# Patient Record
Sex: Male | Born: 1945 | Race: White | Hispanic: No | Marital: Married
Health system: Midwestern US, Community
[De-identification: ages and names within clinical notes are randomized; demographics above are authoritative.]

## PROBLEM LIST (undated history)

## (undated) DIAGNOSIS — R600 Localized edema: Secondary | ICD-10-CM

## (undated) DIAGNOSIS — M170 Bilateral primary osteoarthritis of knee: Secondary | ICD-10-CM

## (undated) DIAGNOSIS — G4730 Sleep apnea, unspecified: Secondary | ICD-10-CM

## (undated) DIAGNOSIS — M21379 Foot drop, unspecified foot: Secondary | ICD-10-CM

## (undated) DIAGNOSIS — E7800 Pure hypercholesterolemia, unspecified: Secondary | ICD-10-CM

## (undated) DIAGNOSIS — N3281 Overactive bladder: Secondary | ICD-10-CM

## (undated) DIAGNOSIS — E876 Hypokalemia: Secondary | ICD-10-CM

## (undated) DIAGNOSIS — R011 Cardiac murmur, unspecified: Secondary | ICD-10-CM

## (undated) DIAGNOSIS — Z95828 Presence of other vascular implants and grafts: Secondary | ICD-10-CM

## (undated) DIAGNOSIS — M1612 Unilateral primary osteoarthritis, left hip: Secondary | ICD-10-CM

## (undated) DIAGNOSIS — Z1211 Encounter for screening for malignant neoplasm of colon: Secondary | ICD-10-CM

## (undated) DIAGNOSIS — G4733 Obstructive sleep apnea (adult) (pediatric): Secondary | ICD-10-CM

## (undated) DIAGNOSIS — I498 Other specified cardiac arrhythmias: Secondary | ICD-10-CM

## (undated) DIAGNOSIS — R9431 Abnormal electrocardiogram [ECG] [EKG]: Secondary | ICD-10-CM

## (undated) DIAGNOSIS — H2510 Age-related nuclear cataract, unspecified eye: Secondary | ICD-10-CM

## (undated) HISTORY — PX: FOOT SURGERY: SHX648

## (undated) HISTORY — PX: CATARACT EXTRACTION: SUR2

## (undated) HISTORY — PX: HIP SURGERY: SHX245

---

## 2003-10-17 HISTORY — PX: IVC FILTER PLACEMENT (ARMC HX): HXRAD1551

## 2011-04-07 NOTE — ED Notes (Signed)
Pt relates pain free at present. Pending md re-eval.

## 2011-04-07 NOTE — ED Notes (Signed)
Discharge and follow up instructions given to patient. Patient advised to follow up as directed or return to ER for symptoms of concern.

## 2011-04-07 NOTE — ED Notes (Signed)
Pt medicated as noted

## 2011-04-07 NOTE — ED Notes (Signed)
Pt developed pain r toe without trauma. Said pain was a 9 then wife changed sock then pain went to a 4 said was very red.

## 2011-04-08 LAB — METABOLIC PANEL, COMPREHENSIVE
A-G Ratio: 0.8 — ABNORMAL LOW (ref 1.0–1.5)
ALT (SGPT): 34 U/L (ref 30–65)
AST (SGOT): 14 U/L — ABNORMAL LOW (ref 15–37)
Albumin: 3.3 g/dL — ABNORMAL LOW (ref 3.4–5.0)
Alk. phosphatase: 93 U/L (ref 50–136)
Anion gap: 8 mmol/L — ABNORMAL LOW (ref 10–20)
BUN: 24 mg/dL — ABNORMAL HIGH (ref 7–20)
Bilirubin, total: 0.6 mg/dL (ref 0.2–1.0)
CO2: 27 mmol/L (ref 21–32)
Calcium: 8.5 mg/dL (ref 8.5–10.1)
Chloride: 105 mmol/L (ref 98–107)
Creatinine: 0.9 mg/dL (ref 0.6–1.3)
GFR est AA: >60 mL/min/{1.73_m2} (ref >60)
GFR est non-AA: >60 mL/min/{1.73_m2} (ref >60)
Globulin: 4.1 g/dL (ref 2.5–5.0)
Glucose: 95 mg/dL (ref 70–100)
Potassium: 3.8 mmol/L (ref 3.5–5.1)
Protein, total: 7.4 g/dL (ref 6.4–8.2)
Sodium: 140 mmol/L (ref 136–145)

## 2011-04-08 LAB — CBC WITH AUTOMATED DIFF
ABS. BASOPHILS: 0 10*3/uL (ref 0.0–0.1)
ABS. EOSINOPHILS: 0.2 10*3/uL (ref 0.0–0.2)
ABS. LYMPHOCYTES: 2.1 10*3/uL (ref 1.0–5.5)
ABS. MONOCYTES: 1.1 10*3/uL — ABNORMAL HIGH (ref 0.1–1.0)
ABS. NEUTROPHILS: 6.1 10*3/uL (ref 2.0–8.1)
BASOPHILS: 0 % (ref 0.0–1.0)
EOSINOPHILS: 2 % (ref 0.0–2.0)
HCT: 41.9 % — ABNORMAL LOW (ref 42.0–52.0)
HGB: 13.6 g/dL — ABNORMAL LOW (ref 14.0–18.0)
LYMPHOCYTES: 22 % (ref 20.5–51.1)
MCH: 27.6 PG (ref 27.0–31.0)
MCHC: 32.5 g/dL (ref 30.5–36.0)
MCV: 85 FL (ref 80.0–96.0)
MONOCYTES: 11 % — ABNORMAL HIGH (ref 1.7–10.0)
MPV: 11.1 FL (ref 10.2–12.7)
NEUTROPHILS: 65 % (ref 42.2–75.2)
PLATELET: 144 10*3/uL (ref 122–400)
RBC: 4.93 M/uL (ref 4.70–6.10)
RDW: 14.8 % — ABNORMAL HIGH (ref 11.4–14.6)
WBC: 9.4 10*3/uL (ref 4.8–10.8)

## 2011-04-08 LAB — URIC ACID: Uric acid: 4.3 mg/dL (ref 3.5–7.2)

## 2011-04-08 MED ORDER — NAPROXEN SODIUM 550 MG TAB
550 mg | ORAL_TABLET | Freq: Two times a day (BID) | ORAL | Status: DC
Start: 2011-04-08 — End: 2013-07-10

## 2011-04-08 MED ORDER — CEPHALEXIN 500 MG CAP
500 mg | ORAL_CAPSULE | Freq: Four times a day (QID) | ORAL | Status: AC
Start: 2011-04-08 — End: 2011-04-14

## 2011-04-08 MED ORDER — KETOROLAC TROMETHAMINE 30 MG/ML INJECTION
30 mg/mL (1 mL) | INTRAMUSCULAR | Status: DC
Start: 2011-04-08 — End: 2011-04-08

## 2011-04-08 MED ORDER — COLCHICINE 0.6 MG TAB
0.6 mg | ORAL | Status: DC
Start: 2011-04-08 — End: 2011-04-08
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
## 2013-05-23 LAB — METABOLIC PANEL, COMPREHENSIVE
A-G Ratio: 0.9 — ABNORMAL LOW (ref 1.0–1.5)
ALT (SGPT): 33 U/L (ref 12–78)
AST (SGOT): 17 U/L (ref 15–37)
Albumin: 3.6 g/dL (ref 3.4–5.0)
Alk. phosphatase: 102 U/L (ref 50–136)
Anion gap: 9 mmol/L (ref 8–20)
BUN: 18 mg/dL (ref 7–18)
Bilirubin, total: 0.7 mg/dL (ref 0.2–1.0)
CO2: 27 mmol/L (ref 21–32)
Calcium: 9.1 mg/dL (ref 8.5–10.1)
Chloride: 105 mmol/L (ref 98–107)
Creatinine: 1 mg/dL (ref 0.6–1.3)
GFR est AA: >60 mL/min/{1.73_m2} (ref >60)
GFR est non-AA: >60 mL/min/{1.73_m2} (ref >60)
Globulin: 4.1 g/dL (ref 2.5–5.0)
Glucose: 111 mg/dL — ABNORMAL HIGH (ref 74–106)
Potassium: 4.4 mmol/L (ref 3.5–5.1)
Protein, total: 7.7 g/dL (ref 6.4–8.2)
Sodium: 141 mmol/L (ref 136–145)

## 2013-05-23 LAB — URINALYSIS W/ RFLX MICROSCOPIC
Bilirubin: NEGATIVE
Glucose: NEGATIVE mg/dL
Ketone: NEGATIVE mg/dL
Leukocyte Esterase: NEGATIVE
Nitrites: NEGATIVE
Protein: NEGATIVE mg/dL
Specific gravity: >1.03 — ABNORMAL HIGH (ref 1.005–1.030)
Urobilinogen: 0.2 EU/dL (ref 0.1–1.0)
pH (UA): 6 (ref 4.5–8.0)

## 2013-05-23 LAB — CBC W/O DIFF
HCT: 47.5 % (ref 42.0–52.0)
HGB: 15.1 g/dL (ref 14.0–18.0)
MCH: 27.7 PG (ref 27.0–31.0)
MCHC: 31.8 g/dL (ref 30.5–36.0)
MCV: 87 FL (ref 80.0–96.0)
MPV: 11.9 FL (ref 10.2–12.7)
PLATELET: 142 10*3/uL (ref 122–400)
RBC: 5.46 M/uL (ref 4.70–6.10)
RDW: 15 % — ABNORMAL HIGH (ref 11.4–14.6)
WBC: 9.6 10*3/uL (ref 4.8–10.8)

## 2013-05-23 LAB — LIPID PANEL
CHOL/HDL Ratio: 4
Cholesterol, total: 157 mg/dL (ref <200)
HDL Cholesterol: 39 mg/dL — ABNORMAL LOW (ref 40–60)
LDL, calculated: 109.2 MG/DL (ref 0–130)
Triglyceride: 44 mg/dL (ref <150)
VLDL, calculated: 8.8 MG/DL (ref 3–35)

## 2013-05-23 LAB — PSA SCREENING (SCREENING): Prostate Specific Ag: 1.9 ng/mL

## 2013-05-23 LAB — URINE MICROSCOPIC

## 2013-05-23 LAB — VITAMIN D, 25 HYDROXY: Vitamin D 25-Hydroxy: 25.3 ng/mL

## 2013-05-23 LAB — HEMOGLOBIN A1C WITH EAG: Hemoglobin A1c: 5.8 % (ref 4.2–6.3)

## 2013-05-23 LAB — TSH 3RD GENERATION: TSH: 1.05 u[IU]/mL (ref 0.360–3.740)

## 2013-07-10 MED ADMIN — acetaminophen (TYLENOL) tablet 1,000 mg: ORAL | @ 15:00:00 | NDC 50580045103

## 2013-07-10 MED ADMIN — lidocaine (XYLOCAINE) 10 mg/mL (1 %) injection 2 mL: INTRADERMAL | @ 15:00:00 | NDC 00409427601
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
## 2013-07-10 NOTE — ED Notes (Signed)
Pt has middle left finger infection   Saw his doctor and was put on a white pill  ??? Pt sts its getting worse   Purple coloration

## 2013-07-10 NOTE — ED Notes (Signed)
Pt is ready for disch to home Patient is awake, alert, and oriented, speech is clear and patient is able to ambulate (if applicable) and ready for discharge. Verbal and written discharge instructions provided and has the cognitive understanding of discharge instructions. Discharged home with family. All questions answered.to follow with pmd for further eval

## 2013-07-20 MED ADMIN — ibuprofen (MOTRIN) tablet 800 mg: ORAL | @ 12:00:00 | NDC 63739044410

## 2013-07-20 MED ADMIN — tramadol (ULTRAM) tablet 50 mg: ORAL | @ 14:00:00 | NDC 62584055911
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
## 2013-07-20 NOTE — ED Notes (Signed)
Lunch tray provided to pt.
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
## 2013-07-20 NOTE — ED Notes (Signed)
Patient is awake, alert, and oriented, speech is clear and patient is able to ambulate and ready for discharge. Verbal and written discharge instructions provided and has the cognitive understanding of discharge instructions. Discharged home with family. All questions answered.
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
## 2013-07-22 LAB — CULTURE, WOUND W GRAM STAIN

## 2013-09-30 LAB — URINALYSIS W/ RFLX MICROSCOPIC
Bilirubin: NEGATIVE
Blood: NEGATIVE
Glucose: NEGATIVE mg/dL
Ketone: NEGATIVE mg/dL
Leukocyte Esterase: NEGATIVE
Nitrites: NEGATIVE
Protein: NEGATIVE mg/dL
Specific gravity: >1.03 — ABNORMAL HIGH (ref 1.005–1.030)
Urobilinogen: 0.2 EU/dL (ref 0.1–1.0)
pH (UA): 5.5 (ref 4.5–8.0)

## 2013-09-30 LAB — URINE MICROSCOPIC

## 2013-10-01 LAB — CULTURE, URINE
Culture result:: <10000
Culture: <10000

## 2013-11-20 LAB — CBC W/O DIFF
HCT: 45.6 % (ref 42.0–52.0)
HGB: 14.7 g/dL (ref 14.0–18.0)
MCH: 28.1 PG (ref 27.0–31.0)
MCHC: 32.2 g/dL (ref 30.5–36.0)
MCV: 87 FL (ref 80.0–96.0)
MPV: 12.3 FL (ref 10.2–12.7)
PLATELET: 161 10*3/uL (ref 122–400)
RBC: 5.24 M/uL (ref 4.70–6.10)
RDW: 14.7 % — ABNORMAL HIGH (ref 11.4–14.6)
WBC: 9.8 10*3/uL (ref 4.8–10.8)

## 2013-11-20 LAB — METABOLIC PANEL, BASIC
Anion gap: 8 mmol/L (ref 8–20)
BUN: 29 mg/dL — ABNORMAL HIGH (ref 7–18)
CO2: 27 mmol/L (ref 21–32)
Calcium: 9.6 mg/dL (ref 8.5–10.1)
Chloride: 107 mmol/L (ref 98–107)
Creatinine: 0.9 mg/dL (ref 0.6–1.3)
GFR est AA: >60 mL/min/{1.73_m2} (ref >60)
GFR est non-AA: >60 mL/min/{1.73_m2} (ref >60)
Glucose: 108 mg/dL — ABNORMAL HIGH (ref 74–106)
Potassium: 4.3 mmol/L (ref 3.5–5.1)
Sodium: 142 mmol/L (ref 136–145)

## 2013-11-20 LAB — EKG, 12 LEAD, INITIAL
Atrial Rate: 66 {beats}/min
Calculated P Axis: 25 degrees
Calculated R Axis: 19 degrees
Calculated T Axis: 62 degrees
P-R Interval: 160 ms
Q-T Interval: 416 ms
QRS Duration: 80 ms
QTC Calculation (Bezet): 436 ms
Ventricular Rate: 66 {beats}/min
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
## 2013-12-02 NOTE — H&P (Signed)
CC:  Progressive loss of vision in his  left eye.  HPI:  This is a 68 y.o. male who has had progressive loss of vision in  his left eye.  He is being admitted for cataract surgery in the left eye.    Ophthalmic history:  Epiretinal membrane right eye;  PPV/membrane peel right eye    Medical history:   Past Medical History   Diagnosis Date   ??? Gastrointestinal disorder      diverticulitis   ??? Arrhythmia      heart murmur   ??? Heart murmur    ??? High cholesterol    ??? Unspecified sleep apnea      uses cpap machine   ??? Arthritis      hips- ? arthritis   ??? Ill-defined condition      dropped right foot- wears brace       Current medications:   Current Outpatient Prescriptions   Medication Sig Dispense Refill   ??? silodosin (RAPAFLO) 4 mg capsule Take 4 mg by mouth daily (with breakfast).       ??? simvastatin (ZOCOR) 10 mg tablet Take 10 mg by mouth nightly.           Allergies: No Known Allergies     Visual acuity:Corrected:            L  20/30            R 20/100  Pupils: equal, round, reactive to light and accommodation no afferent pupillary defect  Ocular motility:extra-ocular motions intact  Slit lamp exam:   Lids and lashes: clean and without defect left eye   Conjunctiva: white and quiet left eye   Cornea: clear and quiet left eye   Anterior Chamber: quiet and deep left eye   Lens: combined cataract left eye    Fundus examination:   normal w/o hemorrhages, exudates, or papilledema, left eye        Impression:  1. Cataract, left eye    Treatment plan:  1. Cataract extraction and intraocular lens implantation,  left eye.  2.  The nature of cataract surgery, and the risks, benefits and alternatives of cataract surgery, including but not limited to, infection, bleeding, retinal detachment, failure to remove cataract, need for additional surgery, vision loss and blindness were discussed with the patient.  All of his questions were answered.  He wishes to have cataract surgery performed.  3.  Medical clearnance by Dr. Regino SchultzeWang.

## 2013-12-03 ENCOUNTER — Inpatient Hospital Stay: Payer: MEDICARE

## 2013-12-03 MED ORDER — OFLOXACIN 0.3 % EYE DROPS
0.3 % | Freq: Once | OPHTHALMIC | Status: AC
Start: 2013-12-03 — End: 2013-12-03

## 2013-12-03 MED ORDER — TRYPAN BLUE 0.06 % INTRAOCULAR SYRINGE
0.06 % | Freq: Once | INTRAOCULAR | Status: DC
Start: 2013-12-03 — End: 2013-12-03

## 2013-12-03 MED ORDER — ACETAMINOPHEN (TYLENOL) SOLUTION 32MG/ML
ORAL | Status: DC | PRN
Start: 2013-12-03 — End: 2013-12-03

## 2013-12-03 MED ORDER — PHENYLEPHRINE 10 % EYE DROPS
10 % | OPHTHALMIC | Status: AC
Start: 2013-12-03 — End: 2013-12-03
  Administered 2013-12-03: 14:00:00 via OPHTHALMIC

## 2013-12-03 MED ORDER — EPINEPHRINE (PF) 1 MG/ML INJECTION
1 mg/mL ( mL) | Freq: Once | INTRAMUSCULAR | Status: AC
Start: 2013-12-03 — End: 2013-12-03
  Administered 2013-12-03: 15:00:00

## 2013-12-03 MED ORDER — CHONDROITIN-SOD HYALURON 3 %-4 % (0.5 ML) 1 %(0.55 ML) INTRAOCULAR KIT
3 %-4 %(0.5 mL) 1 % (0.55 mL) | Freq: Once | INTRAOCULAR | Status: AC
Start: 2013-12-03 — End: 2013-12-03
  Administered 2013-12-03: 15:00:00 via INTRAOCULAR

## 2013-12-03 MED ORDER — BALANCED SALT IRRIG SOLN COMB2 INTRAOCULAR
INTRAOCULAR | Status: AC
Start: 2013-12-03 — End: 2013-12-03
  Administered 2013-12-03: 15:00:00 via INTRAOCULAR

## 2013-12-03 MED ORDER — POVIDONE-IODINE 5 % EYE SOLN
5 % | Freq: Once | OPHTHALMIC | Status: AC
Start: 2013-12-03 — End: 2013-12-03
  Administered 2013-12-03: 16:00:00 via OPHTHALMIC

## 2013-12-03 MED ORDER — TROPICAMIDE 1 % EYE DROPS
1 % | OPHTHALMIC | Status: AC
Start: 2013-12-03 — End: 2013-12-03
  Administered 2013-12-03: 14:00:00 via OPHTHALMIC

## 2013-12-03 MED ORDER — ACETYLCHOLINE CHLORIDE (20 MG/2 ML) INTRAOCULAR KIT
1 % (0 mg/mL) | Freq: Once | INTRAOCULAR | Status: AC
Start: 2013-12-03 — End: 2013-12-03
  Administered 2013-12-03: 16:00:00 via INTRAOCULAR

## 2013-12-03 MED ORDER — CYCLOPENTOLATE 1 % EYE DROPS
1 % | OPHTHALMIC | Status: AC
Start: 2013-12-03 — End: 2013-12-03
  Administered 2013-12-03: 14:00:00 via OPHTHALMIC

## 2013-12-03 MED ADMIN — tetracaine (TETRAVISC) 0.5 % viscous ophthalmic solution: OPHTHALMIC | @ 13:00:00 | NDC 54799050505

## 2013-12-03 MED ADMIN — ofloxacin (FLOXIN) 0.3 % ophthalmic solution: OPHTHALMIC | @ 13:00:00 | NDC 17478071310

## 2013-12-03 MED ADMIN — midazolam (VERSED) injection: INTRAVENOUS | @ 15:00:00 | NDC 00641605725

## 2013-12-03 MED ADMIN — tetracaine (TETRAVISC) 0.5 % viscous ophthalmic solution 1 Drop: OPHTHALMIC | @ 15:00:00 | NDC 54799050505

## 2013-12-03 MED ADMIN — phenylephrine (NEO-SYNEPHRINE) 10 % ophthalmic solution: OPHTHALMIC | @ 13:00:00 | NDC 17478020510

## 2013-12-03 MED ADMIN — cyclopentolate (CYCLOGYL) 1 % ophthalmic solution: OPHTHALMIC | @ 13:00:00 | NDC 17478010002

## 2013-12-03 MED ADMIN — lidocaine (PF) 40 mg/mL (4%) (XYLOCAINE) 22.4 mg, EPINEPHrine HCl (PF) (ADRENALIN) 0.75 mg in balanced salt (BSS) 3 mL ophthalmic solution: OPHTHALMIC | @ 15:00:00 | NDC 63323049089

## 2013-12-03 MED ADMIN — tropicamide (mydRIACYL) 1 % ophthalmic solution: OPHTHALMIC | @ 13:00:00 | NDC 24208058559

## 2013-12-03 MED ADMIN — lactated ringers infusion: INTRAVENOUS | @ 15:00:00 | NDC 00409795309

## 2013-12-03 MED FILL — PHENYLEPHRINE 10 % EYE DROPS: 10 % | OPHTHALMIC | Qty: 5

## 2013-12-03 MED FILL — MIOCHOL-E 1 % (10 MG/ML) INTRAOCULAR KIT: 1 % (0 mg/mL) | INTRAOCULAR | Qty: 2

## 2013-12-03 MED FILL — NORMAL SALINE FLUSH 0.9 % INJECTION SYRINGE: INTRAMUSCULAR | Qty: 10

## 2013-12-03 MED FILL — EPINEPHRINE (PF) 1 MG/ML INJECTION: 1 mg/mL ( mL) | INTRAMUSCULAR | Qty: 4

## 2013-12-03 MED FILL — LACTATED RINGERS IV: INTRAVENOUS | Qty: 1000

## 2013-12-03 MED FILL — TETRAVISC 0.5 % VISCOUS EYE DROPS: 0.5 % | OPHTHALMIC | Qty: 5

## 2013-12-03 MED FILL — CYCLOPENTOLATE 1 % EYE DROPS: 1 % | OPHTHALMIC | Qty: 2

## 2013-12-03 MED FILL — OFLOXACIN 0.3 % EYE DROPS: 0.3 % | OPHTHALMIC | Qty: 5

## 2013-12-03 MED FILL — BETADINE OPHTHALMIC PREP 5 % SOLUTION: 5 % | OPHTHALMIC | Qty: 30

## 2013-12-03 MED FILL — FLURBIPROFEN 0.03 % EYE DROPS: 0.03 % | OPHTHALMIC | Qty: 2.5

## 2013-12-03 MED FILL — XYLOCAINE-MPF 40 MG/ML (4 %) INJECTION SOLUTION: 40 mg/mL (4 %) | INTRAMUSCULAR | Qty: 5

## 2013-12-03 MED FILL — TROPICAMIDE 1 % EYE DROPS: 1 % | OPHTHALMIC | Qty: 2

## 2013-12-03 MED FILL — MIDAZOLAM 1 MG/ML IJ SOLN: 1 mg/mL | INTRAMUSCULAR | Qty: 2

## 2013-12-03 NOTE — Anesthesia Post-Procedure Evaluation (Signed)
Post-Anesthesia Evaluation and Assessment    Patient: Patrick [name] MRN: [id number]  SSN: [id number]    Date of Birth: [birth year]  Age: 68 y.o.  Sex: male       Cardiovascular Function/Vital Signs  Visit Vitals   Item Reading   ??? BP 158/74   ??? Pulse 60   ??? Temp 36.6 ??C (97.9 ??F)   ??? Resp 17   ??? Ht 6\' 1"  (1.854 m)   ??? Wt 124.739 kg (275 lb)   ??? BMI 36.29 kg/m2   ??? SpO2 99%       Patient is status post MAC anesthesia for Procedure(s):  CATARACT EXTRACTION WITH INTRA OCULAR LENS IMPLANT LEFT .    Nausea/Vomiting: None    Postoperative hydration reviewed and adequate.    Pain:  Pain Scale 1: Numeric (0 - 10) (12/03/13 0755)  Pain Intensity 1: 0 (12/03/13 0755)   Managed    Neurological Status:       At baseline    Mental Status and Level of Consciousness: Alert and oriented     Pulmonary Status:   O2 Device: Room air (12/03/13 1042)   Adequate oxygenation and airway patent    Complications related to anesthesia: None    Post-anesthesia assessment completed. No concerns    Signed By: Charlcie CradleMarc O Falyn Rubel, MD     December 03, 2013

## 2013-12-03 NOTE — Brief Op Note (Signed)
BRIEF OPERATIVE NOTE    Date of Procedure: 12/03/2013   Preoperative Diagnosis: CATARACT LEFT EYE  Postoperative Diagnosis: CATARACT LEFT EYE    Procedure(s):  CATARACT EXTRACTION WITH INTRA OCULAR LENS IMPLANT LEFT   Surgeon(s) and Role:     * Erven CollaMandes R Blaike Newburn, MD - Primary  Anesthesia: MAC   Estimated Blood Loss: 0  Specimens: * No specimens in log *   Findings: cataract   Complications: none  Implants:   Implant Name Type Inv. Item Serial No. Manufacturer Lot No. LRB No. Used Action   LENS DIOPTER [id number] 14.0 --  - [id number]   [id number] [employer]   Left 1 Implanted

## 2013-12-03 NOTE — Anesthesia Pre-Procedure Evaluation (Addendum)
Anesthetic History   No history of anesthetic complications           Review of Systems / Medical History  Patient summary reviewed, nursing notes reviewed and pertinent labs reviewed    Pulmonary        Sleep apnea: CPAP         Neuro/Psych   Within defined limits           Cardiovascular            Dysrhythmias         GI/Hepatic/Renal  Within defined limits             Comments: BPH   Endo/Other        Obesity and arthritis     Other Findings   Comments: Hyperlipidemia; Wang MD clearance 11/28/13         Physical Exam    Airway  Mallampati: II  TM Distance: 4 - 6 cm  Neck ROM: normal range of motion   Mouth opening: Normal     Cardiovascular  Regular rate and rhythm,  S1 and S2 normal,  no murmur, click, rub, or gallop             Dental  No notable dental hx       Pulmonary  Breath sounds clear to auscultation               Abdominal  GI exam deferred       Other Findings            Anesthetic Plan    ASA: 2  Anesthesia type: MAC          Induction: Intravenous  Anesthetic plan and risks discussed with: Patient

## 2013-12-03 NOTE — Op Note (Addendum)
The patient was brought to the operating room. Tetravisc solution was applied to the left eye and the left eye was prepped and draped in the usual sterile fashion.  A lid speculum was placed in the left eye.  A paracentesis was made in the limbus at the 1 o'clock position with the 1 mm diamond blade, and without removing the diamond blade, a tunnel was made beginning at the limbus at the 4 o'clock position with the 2.4 mm keratome.  Epi-shugarcaine was instilled into the eye.  Viscoat was applied to the cornea and instilled into the eye.  A  capsulorrhexis was made with the cystotome and utrata forceps. A radial tear occurred at the 12 oclock position and the rhexis was completed in canopener fashion. Balanced salt solution hydrodissection was performed with the Chang cannula.   The lens nucleus was removed by phacoemulsification using a chopping technique.  Lens cortex was removed by irrigation-aspiration.   Provisc was instilled into the eye and a foldable posterior chamber lens implant was injected into the lens capsule.  Provisc was removed by irrigation-aspiration. Balanced salt solution was instilled into the eye and the incisions were checked and found to be watertight.  Betadine solution was applied to the eye. The lid speculum was removed, and a  shield was taped over the eye.  The patient tolerated the procedure well and left the operating room in good condition.

## 2013-12-03 NOTE — Op Note (Signed)
The patient was brought to the operating room. Tetravisc solution was applied to the left eye and the left eye was prepped and draped in the usual sterile fashion.  A lid speculum was placed in the left eye.  A paracentesis was made in the limbus at the 1 o'clock position with the 1 mm diamond blade, and without removing the diamond blade, a tunnel was made beginning at the limbus at the 4 o'clock position with the 2.4 mm keratome.  Epi-shugarcaine was instilled into the eye.  Viscoat was applied to the cornea and instilled into the eye.  A  capsulorrhexis was made with the cystotome and utrata forceps. A radial tear occurred at the 12 oclock position and the rhexis was completed in canopener fashion. Balanced salt solution hydrodissection was performed with the Chang cannula.   The lens nucleus was removed by phacoemulsification using a chopping technique.  Lens cortex was removed by irrigation-aspiration.   Provisc was instilled into the eye and a foldable posterior chamber lens implant was injected into the lens capsule.  Provisc was removed by irrigation-aspiration. Balanced salt solution was instilled into the eye and the incisions were checked and found to be watertight.  Betadine solution was applied to the eye. The lid speculum was removed, and a  shield was taped over the eye.  The patient tolerated the procedure well and left the operating room in good condition.

## 2014-10-19 DIAGNOSIS — M6281 Muscle weakness (generalized): Secondary | ICD-10-CM | POA: Diagnosis not present

## 2014-10-19 DIAGNOSIS — R262 Difficulty in walking, not elsewhere classified: Secondary | ICD-10-CM | POA: Diagnosis not present

## 2014-10-22 DIAGNOSIS — R262 Difficulty in walking, not elsewhere classified: Secondary | ICD-10-CM | POA: Diagnosis not present

## 2014-10-22 DIAGNOSIS — M6281 Muscle weakness (generalized): Secondary | ICD-10-CM | POA: Diagnosis not present

## 2014-10-26 DIAGNOSIS — M6281 Muscle weakness (generalized): Secondary | ICD-10-CM | POA: Diagnosis not present

## 2014-10-26 DIAGNOSIS — R262 Difficulty in walking, not elsewhere classified: Secondary | ICD-10-CM | POA: Diagnosis not present

## 2014-10-29 DIAGNOSIS — M6281 Muscle weakness (generalized): Secondary | ICD-10-CM | POA: Diagnosis not present

## 2014-10-29 DIAGNOSIS — R262 Difficulty in walking, not elsewhere classified: Secondary | ICD-10-CM | POA: Diagnosis not present

## 2014-10-30 DIAGNOSIS — E780 Pure hypercholesterolemia: Secondary | ICD-10-CM | POA: Diagnosis not present

## 2014-10-30 DIAGNOSIS — Z803 Family history of malignant neoplasm of breast: Secondary | ICD-10-CM | POA: Diagnosis not present

## 2014-10-30 DIAGNOSIS — E669 Obesity, unspecified: Secondary | ICD-10-CM | POA: Diagnosis not present

## 2014-10-30 DIAGNOSIS — Z0001 Encounter for general adult medical examination with abnormal findings: Secondary | ICD-10-CM | POA: Diagnosis not present

## 2014-11-02 DIAGNOSIS — M6281 Muscle weakness (generalized): Secondary | ICD-10-CM | POA: Diagnosis not present

## 2014-11-02 DIAGNOSIS — R262 Difficulty in walking, not elsewhere classified: Secondary | ICD-10-CM | POA: Diagnosis not present

## 2014-11-04 ENCOUNTER — Inpatient Hospital Stay: Admit: 2014-11-04 | Payer: MEDICARE | Primary: Internal Medicine

## 2014-11-04 DIAGNOSIS — E669 Obesity, unspecified: Secondary | ICD-10-CM | POA: Diagnosis not present

## 2014-11-04 DIAGNOSIS — E780 Pure hypercholesterolemia: Secondary | ICD-10-CM | POA: Diagnosis not present

## 2014-11-04 DIAGNOSIS — Z0001 Encounter for general adult medical examination with abnormal findings: Secondary | ICD-10-CM | POA: Diagnosis not present

## 2014-11-04 LAB — CBC WITH AUTOMATED DIFF
ABS. BASOPHILS: 0 10*3/uL (ref 0.0–0.1)
ABS. EOSINOPHILS: 0.1 10*3/uL (ref 0.0–0.2)
ABS. LYMPHOCYTES: 1.5 10*3/uL (ref 1.0–5.5)
ABS. MONOCYTES: 1 10*3/uL (ref 0.1–1.0)
ABS. NEUTROPHILS: 5.9 10*3/uL (ref 2.0–8.1)
BASOPHILS: 0 % (ref 0.0–1.0)
EOSINOPHILS: 2 % (ref 0.0–2.0)
HCT: 45.8 % (ref 42.0–52.0)
HGB: 14.7 g/dL (ref 14.0–18.0)
LYMPHOCYTES: 17 % — ABNORMAL LOW (ref 20.5–51.1)
MCH: 28.7 PG (ref 27.0–31.0)
MCHC: 32.1 g/dL (ref 30.5–36.0)
MCV: 89.5 FL (ref 80.0–96.0)
MONOCYTES: 12 % — ABNORMAL HIGH (ref 1.7–10.0)
MPV: 12.7 FL (ref 10.2–12.7)
NEUTROPHILS: 69 % (ref 42.2–75.2)
PLATELET: 155 10*3/uL (ref 122–400)
RBC: 5.12 M/uL (ref 4.70–6.10)
RDW: 14.2 % (ref 11.4–14.6)
WBC: 8.6 10*3/uL (ref 4.8–10.8)

## 2014-11-04 LAB — METABOLIC PANEL, COMPREHENSIVE
A-G Ratio: 1 (ref 1.0–1.5)
ALT (SGPT): 29 U/L (ref 12–78)
AST (SGOT): 17 U/L (ref 15–37)
Albumin: 3.9 g/dL (ref 3.4–5.0)
Alk. phosphatase: 94 U/L (ref 46–116)
Anion gap: 9 mmol/L (ref 8–20)
BUN: 18 mg/dL (ref 7–18)
Bilirubin, total: 0.9 mg/dL (ref 0.2–1.0)
CO2: 29 mmol/L (ref 21–32)
Calcium: 9.1 mg/dL (ref 8.5–10.1)
Chloride: 103 mmol/L (ref 98–107)
Creatinine: 1.18 mg/dL (ref 0.6–1.3)
GFR est AA: >60 mL/min/{1.73_m2} (ref >60)
GFR est non-AA: >60 mL/min/{1.73_m2} (ref >60)
Globulin: 4.1 g/dL (ref 2.5–5.0)
Glucose: 104 mg/dL (ref 74–106)
Potassium: 3.7 mmol/L (ref 3.5–5.1)
Protein, total: 8 g/dL (ref 6.4–8.2)
Sodium: 141 mmol/L (ref 136–145)

## 2014-11-04 LAB — CRP, HIGH SENSITIVITY: CRP, High sensitivity: 2.8 mg/L

## 2014-11-04 LAB — URINALYSIS W/ RFLX MICROSCOPIC
Bilirubin: NEGATIVE
Blood: NEGATIVE
Glucose: NEGATIVE mg/dL
Ketone: NEGATIVE mg/dL
Leukocyte Esterase: NEGATIVE
Nitrites: NEGATIVE
Protein: NEGATIVE mg/dL
Specific gravity: >1.03 — ABNORMAL HIGH (ref 1.005–1.030)
Urobilinogen: 0.2 EU/dL (ref 0.1–1.0)
pH (UA): 5.5 (ref 4.5–8.0)

## 2014-11-04 LAB — HEMOGLOBIN A1C WITH EAG: Hemoglobin A1c: 6.3 % (ref 4.2–6.3)

## 2014-11-04 LAB — URINE MICROSCOPIC

## 2014-11-04 LAB — PSA, DIAGNOSTIC (PROSTATE SPECIFIC AG): Prostate Specific Ag: 2.1 ng/mL (ref 0–4.0)

## 2014-11-04 LAB — LIPID PANEL
CHOL/HDL Ratio: 3.9
Cholesterol, total: 165 mg/dL (ref <200)
HDL Cholesterol: 42 mg/dL (ref 40–60)
LDL, calculated: 108.4 MG/DL (ref 0–130)
Triglyceride: 73 mg/dL (ref <150)
VLDL, calculated: 14.6 MG/DL (ref 3–35)

## 2014-11-04 LAB — VITAMIN D, 25 HYDROXY: Vitamin D 25-Hydroxy: 19 ng/mL

## 2014-11-05 DIAGNOSIS — R262 Difficulty in walking, not elsewhere classified: Secondary | ICD-10-CM | POA: Diagnosis not present

## 2014-11-05 DIAGNOSIS — M6281 Muscle weakness (generalized): Secondary | ICD-10-CM | POA: Diagnosis not present

## 2014-11-09 DIAGNOSIS — R262 Difficulty in walking, not elsewhere classified: Secondary | ICD-10-CM | POA: Diagnosis not present

## 2014-11-09 DIAGNOSIS — M6281 Muscle weakness (generalized): Secondary | ICD-10-CM | POA: Diagnosis not present

## 2014-11-12 DIAGNOSIS — M6281 Muscle weakness (generalized): Secondary | ICD-10-CM | POA: Diagnosis not present

## 2014-11-12 DIAGNOSIS — R262 Difficulty in walking, not elsewhere classified: Secondary | ICD-10-CM | POA: Diagnosis not present

## 2014-11-16 ENCOUNTER — Encounter

## 2014-11-16 DIAGNOSIS — M6281 Muscle weakness (generalized): Secondary | ICD-10-CM | POA: Diagnosis not present

## 2014-11-16 DIAGNOSIS — R262 Difficulty in walking, not elsewhere classified: Secondary | ICD-10-CM | POA: Diagnosis not present

## 2014-11-19 ENCOUNTER — Encounter

## 2014-11-19 DIAGNOSIS — M6281 Muscle weakness (generalized): Secondary | ICD-10-CM | POA: Diagnosis not present

## 2014-11-19 DIAGNOSIS — R262 Difficulty in walking, not elsewhere classified: Secondary | ICD-10-CM | POA: Diagnosis not present

## 2014-11-20 ENCOUNTER — Inpatient Hospital Stay: Payer: PRIVATE HEALTH INSURANCE | Attending: Internal Medicine | Primary: Internal Medicine

## 2014-11-20 ENCOUNTER — Ambulatory Visit: Payer: PRIVATE HEALTH INSURANCE | Primary: Internal Medicine

## 2014-11-23 DIAGNOSIS — R262 Difficulty in walking, not elsewhere classified: Secondary | ICD-10-CM | POA: Diagnosis not present

## 2014-11-23 DIAGNOSIS — M6281 Muscle weakness (generalized): Secondary | ICD-10-CM | POA: Diagnosis not present

## 2014-11-26 DIAGNOSIS — R262 Difficulty in walking, not elsewhere classified: Secondary | ICD-10-CM | POA: Diagnosis not present

## 2014-11-26 DIAGNOSIS — M6281 Muscle weakness (generalized): Secondary | ICD-10-CM | POA: Diagnosis not present

## 2014-11-27 ENCOUNTER — Ambulatory Visit: Payer: PRIVATE HEALTH INSURANCE | Primary: Internal Medicine

## 2014-11-30 DIAGNOSIS — M6281 Muscle weakness (generalized): Secondary | ICD-10-CM | POA: Diagnosis not present

## 2014-11-30 DIAGNOSIS — R262 Difficulty in walking, not elsewhere classified: Secondary | ICD-10-CM | POA: Diagnosis not present

## 2014-12-01 ENCOUNTER — Encounter: Payer: PRIVATE HEALTH INSURANCE | Primary: Internal Medicine

## 2014-12-03 DIAGNOSIS — M6281 Muscle weakness (generalized): Secondary | ICD-10-CM | POA: Diagnosis not present

## 2014-12-03 DIAGNOSIS — R262 Difficulty in walking, not elsewhere classified: Secondary | ICD-10-CM | POA: Diagnosis not present

## 2014-12-04 ENCOUNTER — Ambulatory Visit: Payer: PRIVATE HEALTH INSURANCE | Primary: Internal Medicine

## 2014-12-04 ENCOUNTER — Inpatient Hospital Stay: Payer: PRIVATE HEALTH INSURANCE | Attending: Internal Medicine | Primary: Internal Medicine

## 2014-12-07 DIAGNOSIS — M6281 Muscle weakness (generalized): Secondary | ICD-10-CM | POA: Diagnosis not present

## 2014-12-07 DIAGNOSIS — R262 Difficulty in walking, not elsewhere classified: Secondary | ICD-10-CM | POA: Diagnosis not present

## 2014-12-10 DIAGNOSIS — M6281 Muscle weakness (generalized): Secondary | ICD-10-CM | POA: Diagnosis not present

## 2014-12-10 DIAGNOSIS — R262 Difficulty in walking, not elsewhere classified: Secondary | ICD-10-CM | POA: Diagnosis not present

## 2014-12-14 DIAGNOSIS — R262 Difficulty in walking, not elsewhere classified: Secondary | ICD-10-CM | POA: Diagnosis not present

## 2014-12-14 DIAGNOSIS — M6281 Muscle weakness (generalized): Secondary | ICD-10-CM | POA: Diagnosis not present

## 2014-12-17 DIAGNOSIS — R262 Difficulty in walking, not elsewhere classified: Secondary | ICD-10-CM | POA: Diagnosis not present

## 2014-12-17 DIAGNOSIS — M6281 Muscle weakness (generalized): Secondary | ICD-10-CM | POA: Diagnosis not present

## 2014-12-18 DIAGNOSIS — H35373 Puckering of macula, bilateral: Secondary | ICD-10-CM | POA: Diagnosis not present

## 2014-12-18 DIAGNOSIS — H3581 Retinal edema: Secondary | ICD-10-CM | POA: Diagnosis not present

## 2014-12-21 DIAGNOSIS — M6281 Muscle weakness (generalized): Secondary | ICD-10-CM | POA: Diagnosis not present

## 2014-12-21 DIAGNOSIS — R262 Difficulty in walking, not elsewhere classified: Secondary | ICD-10-CM | POA: Diagnosis not present

## 2014-12-25 ENCOUNTER — Ambulatory Visit: Payer: PRIVATE HEALTH INSURANCE | Primary: Internal Medicine

## 2014-12-25 DIAGNOSIS — M6281 Muscle weakness (generalized): Secondary | ICD-10-CM | POA: Diagnosis not present

## 2014-12-25 DIAGNOSIS — R262 Difficulty in walking, not elsewhere classified: Secondary | ICD-10-CM | POA: Diagnosis not present

## 2014-12-28 DIAGNOSIS — M6281 Muscle weakness (generalized): Secondary | ICD-10-CM | POA: Diagnosis not present

## 2014-12-28 DIAGNOSIS — R262 Difficulty in walking, not elsewhere classified: Secondary | ICD-10-CM | POA: Diagnosis not present

## 2014-12-31 DIAGNOSIS — R262 Difficulty in walking, not elsewhere classified: Secondary | ICD-10-CM | POA: Diagnosis not present

## 2014-12-31 DIAGNOSIS — M6281 Muscle weakness (generalized): Secondary | ICD-10-CM | POA: Diagnosis not present

## 2015-01-04 DIAGNOSIS — M6281 Muscle weakness (generalized): Secondary | ICD-10-CM | POA: Diagnosis not present

## 2015-01-04 DIAGNOSIS — R262 Difficulty in walking, not elsewhere classified: Secondary | ICD-10-CM | POA: Diagnosis not present

## 2015-01-07 ENCOUNTER — Inpatient Hospital Stay: Admit: 2015-01-07 | Payer: MEDICARE | Attending: Internal Medicine | Primary: Internal Medicine

## 2015-01-07 DIAGNOSIS — R9431 Abnormal electrocardiogram [ECG] [EKG]: Secondary | ICD-10-CM | POA: Diagnosis not present

## 2015-01-07 MED ORDER — REGADENOSON 0.4 MG/5 ML IV SYRINGE
0.4 mg/5 mL | Freq: Once | INTRAVENOUS | Status: AC
Start: 2015-01-07 — End: 2015-01-07
  Administered 2015-01-07: 14:00:00 via INTRAVENOUS

## 2015-01-07 MED ORDER — AMINOPHYLLINE 250 MG/10 ML IV
250 mg/10 mL | Freq: Once | INTRAVENOUS | Status: AC
Start: 2015-01-07 — End: 2015-01-07
  Administered 2015-01-07: 14:00:00 via INTRAVENOUS

## 2015-01-07 MED ORDER — TECHNETIUM TC 99M SESTAMIBI - CARDIOLITE
Freq: Once | Status: AC
Start: 2015-01-07 — End: 2015-01-07
  Administered 2015-01-07: 13:00:00 via INTRAVENOUS

## 2015-01-07 MED ORDER — TECHNETIUM TC 99M SESTAMIBI - CARDIOLITE
Freq: Once | Status: AC
Start: 2015-01-07 — End: 2015-01-07
  Administered 2015-01-07: 14:00:00 via INTRAVENOUS

## 2015-01-07 MED FILL — LEXISCAN 0.4 MG/5 ML INTRAVENOUS SYRINGE: 0.4 mg/5 mL | INTRAVENOUS | Qty: 5

## 2015-01-07 MED FILL — TECHNETIUM TC 99M SESTAMIBI - CARDIOLITE: Qty: 30

## 2015-01-07 MED FILL — TECHNETIUM TC 99M SESTAMIBI - CARDIOLITE: Qty: 10

## 2015-01-07 MED FILL — AMINOPHYLLINE 250 MG/10 ML IV: 250 mg/10 mL | INTRAVENOUS | Qty: 10

## 2015-01-07 NOTE — Procedures (Signed)
[place] [facility]  NUCLEAR STRESS TEST    Name:  Patrick [name], Patrick [name]  MR#:  [id number]  DOB:  08/11/1946  Account #:  [id number]  Date of Adm:  01/07/2015      DATE:    IMPRESSION: This is a same day rest, followed by stress Myoview  study. The study is of good technical quality without motion artifact.    SPECT ANALYSIS: Both the rest and stress images revealed normal  tracer uptake in all segments of the left ventricle.    GATED SPECT: Left ventricle volume is within the upper limits of  normal with normal systolic function at rest. Ejection fraction was  64%. Post-stress images, the left ventricular systolic function  dropped to 55%.    TEST CONCLUSION: Normal SPECT stress Myoview study without evidence  of inducible myocardial ischemia.        Eugen Jeansonne MD   :[id number]  D:  01/07/2015   12:25  T:  01/07/2015   20:34  Job #:  [id number]

## 2015-01-07 NOTE — Procedures (Signed)
[place] [facility]  NUCLEAR STRESS TEST    Name:  [name], Patrick  MR#:  [id number]  DOB:  02/04/1946  Account #:  [id number]  Date of Adm:  01/07/2015      DATE:    IMPRESSION: The patient underwent Lexiscan pharmacologic stress test  according to routine protocol. There were no complaints of chest pain  or chest discomfort throughout testing. The patient's blood pressure  response was appropriate. Baseline EKG showed sinus rhythm with APCs,  there were no ST-T changes of ischemia throughout testing. There were  no arrhythmias during the testing.    TEST CONCLUSION: Negative Lexiscan. Myoview scan report to follow.        Moussa Wiegand MD   :[id number]  D:  01/07/2015   11:04  T:  01/07/2015   18:52  Job #:  [id number]

## 2015-01-07 NOTE — Procedures (Signed)
[place] [facility]  NUCLE[facility]    Name:  Patrick RilyYTELL, Patrick  MR#:  [id number]  DOB:  1946-03-19  Account #:  [id number]  Date of Adm:  01/07/2015      DATE:    IMPRESSION: This is a same day rest, followed by stress Myoview  study. The study is of good technical quality without motion artifact.    SPECT ANALYSIS: Both the rest and stress images revealed normal  tracer uptake in all segments of the left ventricle.    GATED SPECT: Left ventricle volume is within the upper limits of  normal with normal systolic function at rest. Ejection fraction was  64%. Post-stress images, the left ventricular systolic function  dropped to 95%55%.    TEST CONCLUSION: Normal SPECT stress Myoview study without evidence  of inducible myocardial ischemia.        Barrett HenleKAZANJIAN, Kaelyn Innocent MD   :[id number]  D:  01/07/2015   12:25  T:  01/07/2015   20:34  Job #:  [id number]

## 2015-01-07 NOTE — Procedures (Signed)
[place] [facility]  NUCLE[facility]    Name:  Patrick [name], [name]  MR#:  [id number]  DOB:  Aug 21, 1946  Account #:  [id number]  Date of Adm:  01/07/2015      DATE:    IMPRESSION: The patient underwent Lexiscan pharmacologic stress test  according to routine protocol. There were no complaints of chest pain  or chest discomfort throughout testing. The patient's blood pressure  response was appropriate. Baseline EKG showed sinus rhythm with APCs,  there were no ST-T changes of ischemia throughout testing. There were  no arrhythmias during the testing.    TEST CONCLUSION: Negative Lexiscan. Myoview scan report to follow.        Barrett HenleKAZANJIAN, Nguyet Mercer MD   :[id number]  D:  01/07/2015   11:04  T:  01/07/2015   18:52  Job #:  [id number]

## 2015-01-14 DIAGNOSIS — R269 Unspecified abnormalities of gait and mobility: Secondary | ICD-10-CM | POA: Diagnosis not present

## 2015-01-18 DIAGNOSIS — R269 Unspecified abnormalities of gait and mobility: Secondary | ICD-10-CM | POA: Diagnosis not present

## 2015-01-21 DIAGNOSIS — R269 Unspecified abnormalities of gait and mobility: Secondary | ICD-10-CM | POA: Diagnosis not present

## 2015-01-25 DIAGNOSIS — R269 Unspecified abnormalities of gait and mobility: Secondary | ICD-10-CM | POA: Diagnosis not present

## 2015-01-27 DIAGNOSIS — R269 Unspecified abnormalities of gait and mobility: Secondary | ICD-10-CM | POA: Diagnosis not present

## 2015-02-01 DIAGNOSIS — R269 Unspecified abnormalities of gait and mobility: Secondary | ICD-10-CM | POA: Diagnosis not present

## 2015-02-04 DIAGNOSIS — R269 Unspecified abnormalities of gait and mobility: Secondary | ICD-10-CM | POA: Diagnosis not present

## 2015-02-08 DIAGNOSIS — R262 Difficulty in walking, not elsewhere classified: Secondary | ICD-10-CM | POA: Diagnosis not present

## 2015-02-08 DIAGNOSIS — M6281 Muscle weakness (generalized): Secondary | ICD-10-CM | POA: Diagnosis not present

## 2015-02-08 DIAGNOSIS — R269 Unspecified abnormalities of gait and mobility: Secondary | ICD-10-CM | POA: Diagnosis not present

## 2015-02-11 DIAGNOSIS — R269 Unspecified abnormalities of gait and mobility: Secondary | ICD-10-CM | POA: Diagnosis not present

## 2015-02-15 DIAGNOSIS — R269 Unspecified abnormalities of gait and mobility: Secondary | ICD-10-CM | POA: Diagnosis not present

## 2015-02-15 DIAGNOSIS — M6281 Muscle weakness (generalized): Secondary | ICD-10-CM | POA: Diagnosis not present

## 2015-02-15 DIAGNOSIS — R262 Difficulty in walking, not elsewhere classified: Secondary | ICD-10-CM | POA: Diagnosis not present

## 2015-02-17 DIAGNOSIS — R262 Difficulty in walking, not elsewhere classified: Secondary | ICD-10-CM | POA: Diagnosis not present

## 2015-02-17 DIAGNOSIS — M6281 Muscle weakness (generalized): Secondary | ICD-10-CM | POA: Diagnosis not present

## 2015-02-18 DIAGNOSIS — R269 Unspecified abnormalities of gait and mobility: Secondary | ICD-10-CM | POA: Diagnosis not present

## 2015-02-22 DIAGNOSIS — R262 Difficulty in walking, not elsewhere classified: Secondary | ICD-10-CM | POA: Diagnosis not present

## 2015-02-22 DIAGNOSIS — M6281 Muscle weakness (generalized): Secondary | ICD-10-CM | POA: Diagnosis not present

## 2015-02-25 DIAGNOSIS — R269 Unspecified abnormalities of gait and mobility: Secondary | ICD-10-CM | POA: Diagnosis not present

## 2015-03-01 DIAGNOSIS — R269 Unspecified abnormalities of gait and mobility: Secondary | ICD-10-CM | POA: Diagnosis not present

## 2015-07-08 DIAGNOSIS — E780 Pure hypercholesterolemia: Secondary | ICD-10-CM | POA: Diagnosis not present

## 2015-07-08 DIAGNOSIS — N400 Enlarged prostate without lower urinary tract symptoms: Secondary | ICD-10-CM | POA: Diagnosis not present

## 2015-07-08 DIAGNOSIS — E669 Obesity, unspecified: Secondary | ICD-10-CM | POA: Diagnosis not present

## 2015-07-21 DIAGNOSIS — H35373 Puckering of macula, bilateral: Secondary | ICD-10-CM | POA: Diagnosis not present

## 2015-07-21 DIAGNOSIS — H3581 Retinal edema: Secondary | ICD-10-CM | POA: Diagnosis not present

## 2015-09-01 DIAGNOSIS — N3941 Urge incontinence: Secondary | ICD-10-CM | POA: Diagnosis not present

## 2015-09-06 DIAGNOSIS — N400 Enlarged prostate without lower urinary tract symptoms: Secondary | ICD-10-CM | POA: Diagnosis not present

## 2015-09-06 DIAGNOSIS — G4733 Obstructive sleep apnea (adult) (pediatric): Secondary | ICD-10-CM | POA: Diagnosis not present

## 2015-09-06 DIAGNOSIS — E669 Obesity, unspecified: Secondary | ICD-10-CM | POA: Diagnosis not present

## 2015-09-06 DIAGNOSIS — E7800 Pure hypercholesterolemia, unspecified: Secondary | ICD-10-CM | POA: Diagnosis not present

## 2015-10-06 DIAGNOSIS — N3941 Urge incontinence: Secondary | ICD-10-CM | POA: Diagnosis not present

## 2015-10-20 ENCOUNTER — Inpatient Hospital Stay: Admit: 2015-10-28 | Payer: PRIVATE HEALTH INSURANCE | Attending: Internal Medicine | Primary: Internal Medicine

## 2015-10-20 DIAGNOSIS — G4733 Obstructive sleep apnea (adult) (pediatric): Secondary | ICD-10-CM

## 2015-10-28 ENCOUNTER — Encounter

## 2015-11-04 DIAGNOSIS — N401 Enlarged prostate with lower urinary tract symptoms: Secondary | ICD-10-CM | POA: Diagnosis not present

## 2015-11-04 DIAGNOSIS — G4733 Obstructive sleep apnea (adult) (pediatric): Secondary | ICD-10-CM | POA: Diagnosis not present

## 2015-11-04 DIAGNOSIS — R609 Edema, unspecified: Secondary | ICD-10-CM | POA: Diagnosis not present

## 2015-11-04 DIAGNOSIS — E7800 Pure hypercholesterolemia, unspecified: Secondary | ICD-10-CM | POA: Diagnosis not present

## 2015-11-12 ENCOUNTER — Inpatient Hospital Stay: Admit: 2015-11-12 | Payer: MEDICARE | Primary: Internal Medicine

## 2015-11-12 DIAGNOSIS — Z125 Encounter for screening for malignant neoplasm of prostate: Secondary | ICD-10-CM | POA: Diagnosis not present

## 2015-11-12 DIAGNOSIS — E559 Vitamin D deficiency, unspecified: Secondary | ICD-10-CM | POA: Diagnosis not present

## 2015-11-12 DIAGNOSIS — E7800 Pure hypercholesterolemia, unspecified: Secondary | ICD-10-CM | POA: Diagnosis not present

## 2015-11-12 DIAGNOSIS — E669 Obesity, unspecified: Secondary | ICD-10-CM | POA: Diagnosis not present

## 2015-11-12 DIAGNOSIS — E119 Type 2 diabetes mellitus without complications: Secondary | ICD-10-CM | POA: Diagnosis not present

## 2015-11-12 LAB — CBC WITH AUTOMATED DIFF
ABS. BASOPHILS: 0 10*3/uL (ref 0.0–0.1)
ABS. EOSINOPHILS: 0.2 10*3/uL (ref 0.0–0.2)
ABS. LYMPHOCYTES: 1.6 10*3/uL (ref 1.0–5.5)
ABS. MONOCYTES: 1 10*3/uL (ref 0.1–1.0)
ABS. NEUTROPHILS: 6.2 10*3/uL (ref 2.0–8.1)
BASOPHILS: 0 % (ref 0.0–1.0)
EOSINOPHILS: 2 % (ref 0.0–2.0)
HCT: 44 % (ref 42.0–52.0)
HGB: 14.1 g/dL (ref 14.0–18.0)
LYMPHOCYTES: 18 % — ABNORMAL LOW (ref 20.5–51.1)
MCH: 28.4 PG (ref 27.0–31.0)
MCHC: 32 g/dL (ref 30.5–36.0)
MCV: 88.5 FL (ref 80.0–96.0)
MONOCYTES: 12 % — ABNORMAL HIGH (ref 1.7–10.0)
MPV: 12.6 FL (ref 10.2–12.7)
NEUTROPHILS: 68 % (ref 42.2–75.2)
PLATELET: 145 10*3/uL (ref 122–400)
RBC: 4.97 M/uL (ref 4.70–6.10)
RDW: 14.7 % — ABNORMAL HIGH (ref 11.4–14.6)
WBC: 9.1 10*3/uL (ref 4.8–10.8)

## 2015-11-12 LAB — URINALYSIS W/ RFLX MICROSCOPIC
Bilirubin: NEGATIVE
Glucose: NEGATIVE mg/dL
Ketone: NEGATIVE mg/dL
Nitrites: POSITIVE — AB
Protein: NEGATIVE mg/dL
Specific gravity: 1.015 (ref 1.005–1.030)
Urobilinogen: 0.2 EU/dL (ref 0.1–1.0)
pH (UA): 5.5 (ref 4.5–8.0)

## 2015-11-12 LAB — METABOLIC PANEL, COMPREHENSIVE
A-G Ratio: 0.9 — ABNORMAL LOW (ref 1.0–1.5)
ALT (SGPT): 19 U/L (ref 12–78)
AST (SGOT): 11 U/L — ABNORMAL LOW (ref 15–37)
Albumin: 3.7 g/dL (ref 3.4–5.0)
Alk. phosphatase: 75 U/L (ref 46–116)
Anion gap: 8 mmol/L (ref 8–20)
BUN: 22 mg/dL — ABNORMAL HIGH (ref 7–18)
Bilirubin, total: 0.9 mg/dL (ref 0.2–1.0)
CO2: 29 mmol/L (ref 21–32)
Calcium: 9.1 mg/dL (ref 8.5–10.1)
Chloride: 100 mmol/L (ref 98–107)
Creatinine: 1.07 mg/dL (ref 0.70–1.30)
GFR est AA: >60 mL/min/{1.73_m2} (ref >60)
GFR est non-AA: >60 mL/min/{1.73_m2} (ref >60)
Globulin: 4.2 g/dL (ref 2.5–5.0)
Glucose: 107 mg/dL — ABNORMAL HIGH (ref 74–106)
Potassium: 4 mmol/L (ref 3.5–5.1)
Protein, total: 7.9 g/dL (ref 6.4–8.2)
Sodium: 136 mmol/L (ref 136–145)

## 2015-11-12 LAB — URINE MICROSCOPIC

## 2015-11-12 LAB — LIPID PANEL
CHOL/HDL Ratio: 3.6
Cholesterol, total: 143 mg/dL (ref <200)
HDL Cholesterol: 40 mg/dL (ref 40–60)
LDL, calculated: 89.2 MG/DL (ref 0–130)
Triglyceride: 69 mg/dL (ref <150)
VLDL, calculated: 13.8 MG/DL (ref 3–35)

## 2015-11-12 LAB — HEMOGLOBIN A1C W/O EAG: Hemoglobin A1c: 5.7 % (ref 4.2–6.3)

## 2015-11-12 LAB — URIC ACID: Uric acid: 5.8 mg/dL (ref 3.5–7.2)

## 2015-11-12 LAB — VITAMIN D, 25 HYDROXY: Vitamin D 25-Hydroxy: 12.7 ng/mL

## 2015-11-12 LAB — PSA SCREENING (SCREENING): Prostate Specific Ag: 1.1 ng/mL (ref 0–4.0)

## 2015-11-12 LAB — TSH 3RD GENERATION: TSH: 1.13 u[IU]/mL (ref 0.360–3.740)

## 2015-12-01 DIAGNOSIS — Z1211 Encounter for screening for malignant neoplasm of colon: Secondary | ICD-10-CM | POA: Diagnosis not present

## 2016-01-19 ENCOUNTER — Inpatient Hospital Stay: Payer: MEDICARE

## 2016-01-19 DIAGNOSIS — Z79899 Other long term (current) drug therapy: Secondary | ICD-10-CM | POA: Diagnosis not present

## 2016-01-19 DIAGNOSIS — E7800 Pure hypercholesterolemia, unspecified: Secondary | ICD-10-CM | POA: Diagnosis not present

## 2016-01-19 DIAGNOSIS — K5730 Diverticulosis of large intestine without perforation or abscess without bleeding: Secondary | ICD-10-CM | POA: Diagnosis not present

## 2016-01-19 DIAGNOSIS — Z6835 Body mass index (BMI) 35.0-35.9, adult: Secondary | ICD-10-CM | POA: Diagnosis not present

## 2016-01-19 DIAGNOSIS — Z1211 Encounter for screening for malignant neoplasm of colon: Secondary | ICD-10-CM | POA: Diagnosis not present

## 2016-01-19 DIAGNOSIS — R011 Cardiac murmur, unspecified: Secondary | ICD-10-CM | POA: Diagnosis not present

## 2016-01-19 DIAGNOSIS — E669 Obesity, unspecified: Secondary | ICD-10-CM | POA: Diagnosis not present

## 2016-01-19 DIAGNOSIS — G4730 Sleep apnea, unspecified: Secondary | ICD-10-CM | POA: Diagnosis not present

## 2016-01-19 MED ORDER — LIDOCAINE (PF) 20 MG/ML (2 %) IJ SOLN
20 mg/mL (2 %) | INTRAMUSCULAR | Status: AC
Start: 2016-01-19 — End: ?

## 2016-01-19 MED ORDER — LIDOCAINE (PF) 20 MG/ML (2 %) IJ SOLN
20 mg/mL (2 %) | INTRAMUSCULAR | Status: DC | PRN
Start: 2016-01-19 — End: 2016-01-19
  Administered 2016-01-19: 16:00:00 via INTRAVENOUS

## 2016-01-19 MED ORDER — PROPOFOL 10 MG/ML IV EMUL
10 mg/mL | INTRAVENOUS | Status: DC | PRN
Start: 2016-01-19 — End: 2016-01-19
  Administered 2016-01-19 (×3): via INTRAVENOUS

## 2016-01-19 MED ORDER — LACTATED RINGERS IV
INTRAVENOUS | Status: DC | PRN
Start: 2016-01-19 — End: 2016-01-19
  Administered 2016-01-19: 16:00:00 via INTRAVENOUS

## 2016-01-19 MED ORDER — LACTATED RINGERS IV
INTRAVENOUS | Status: DC
Start: 2016-01-19 — End: 2016-01-19

## 2016-01-19 MED ORDER — PROPOFOL 10 MG/ML IV EMUL
10 mg/mL | INTRAVENOUS | Status: AC
Start: 2016-01-19 — End: ?

## 2016-01-19 MED FILL — PROPOFOL 10 MG/ML IV EMUL: 10 mg/mL | INTRAVENOUS | Qty: 40

## 2016-01-19 MED FILL — LIDOCAINE (PF) 20 MG/ML (2 %) IJ SOLN: 20 mg/mL (2 %) | INTRAMUSCULAR | Qty: 5

## 2016-01-19 MED FILL — LACTATED RINGERS IV: INTRAVENOUS | Qty: 1000

## 2016-01-19 NOTE — Anesthesia Pre-Procedure Evaluation (Addendum)
Anesthetic History   No history of anesthetic complications            Review of Systems / Medical History  Patient summary reviewed, nursing notes reviewed and pertinent labs reviewed    Pulmonary        Sleep apnea: CPAP           Neuro/Psych   Within defined limits           Cardiovascular      Valvular problems/murmurs (MURMUR)                 GI/Hepatic/Renal  Within defined limits             Comments: BPH Endo/Other        Obesity, morbid obesity and arthritis     Other Findings   Comments: Hyperlipidemia; Wang MD clearance 11/28/13         Physical Exam    Airway  Mallampati: II  TM Distance: 4 - 6 cm  Neck ROM: normal range of motion   Mouth opening: Normal     Cardiovascular  Regular rate and rhythm,  S1 and S2 normal,  no murmur, click, rub, or gallop             Dental         Pulmonary  Breath sounds clear to auscultation               Abdominal  GI exam deferred       Other Findings            Anesthetic Plan    ASA: 2  Anesthesia type: total IV anesthesia and general          Induction: Intravenous  Anesthetic plan and risks discussed with: Patient
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
## 2016-01-19 NOTE — Op Note (Signed)
[place] [facility]  COLONOSCOPY    Name:  Patrick [name], Patrick [name]  MR#:  [id number]  DOB:  06/12/1946  Account #:  [id number]  Date of Adm:  01/19/2016      DATE OF PROCEDURE:  01/19/2016    ENDOSCOPIST:  Ivonne Freeburg, MD    PREOPERATIVE DIAGNOSIS: Colon cancer screening.    POSTOPERATIVE DIAGNOSIS: Diverticulosis.    PROCEDURE PERFORMED  1. Colonoscopy.  2. Ileoscopy.    ANESTHESIA: MAC.    ANESTHESIOLOGIST:  Elizabeth Kao, MD    DESCRIPTION OF PROCEDURE:  Findings: Digital rectal examination  performed, no masses. Terminal ileum, cecum, ascending, and transverse  normal. Descending and sigmoid had diverticulosis. Rectum normal.  Retroflexion, no additional lesions seen. Tolerated well. No  biopsies.    PLAN: To be maintained on fiber.        Danni Leabo MD   :[id number]  D:  01/19/2016   12:05  T:  01/19/2016   12:37  Job #:  [id number]

## 2016-01-19 NOTE — Anesthesia Post-Procedure Evaluation (Signed)
Post-Anesthesia Evaluation and Assessment    Patient: Patrick [name] MRN: [id number]  SSN: [id number]    Date of Birth: May 14, 1946  Age: 70 y.o.  Sex: male       Cardiovascular Function/Vital Signs  Visit Vitals   ??? BP 126/73   ??? Pulse 62   ??? Temp 36.9 ??C (98.4 ??F)   ??? Resp 16   ??? Ht 6\' 2"  (1.88 m)   ??? Wt 127 kg (280 lb)   ??? SpO2 96%   ??? BMI 35.95 kg/m2       Patient is status post total IV anesthesia, general anesthesia for Procedure(s):  COLONOSCOPY.    Nausea/Vomiting: None    Postoperative hydration reviewed and adequate.    Pain:  Pain Scale 1: Numeric (0 - 10) (01/19/16 1231)  Pain Intensity 1: 0 (01/19/16 1231)   Managed    Neurological Status:       At baseline    Mental Status and Level of Consciousness: Arousable    Pulmonary Status:   O2 Device: Room air (01/19/16 1231)   Adequate oxygenation and airway patent    Complications related to anesthesia: None    Post-anesthesia assessment completed. No concerns    Signed By: Rose PhiAleksey A. Cross Jorge, MD     January 19, 2016

## 2016-01-19 NOTE — Procedures (Signed)
Colonoscopy Operative Report    Patrick [name]  [id number]  12/27/1945      Procedure Type:   Colonoscopy    Indications:  Screen     Operator:  Adonia Porada J. Shenoa Hattabaugh, MD      Referring Provider: Shuang-Ping Wang, MD      Sedation:  MAC anesthesia      Procedure Details:  After informed consent was obtained with all risks and benefits of procedure explained and preoperative exam completed, the patient was taken to the endoscopy suite and placed in the left lateral decubitus position.  Upon sedation as per above, a digital rectal exam was performed.  The Olympus videocolonoscope  was inserted in the rectum and carefully advanced to the terminal ileum.  The cecum was identified by the ileocecal valve and appendiceal orifice.  The quality of preparation was excellent.  The colonoscope was slowly withdrawn with careful evaluation between folds. Retroflexion in the rectum was completed .     Findings:   Rectum: normal  Sigmoid: moderate diverticulosis;  Descending Colon: mild diverticulosis;  Transverse Colon: normal  Ascending Colon: normal  Cecum: normal  Terminal Ileum: normal      Specimen Removed:  none    Complications: None.     EBL:  None.    Impression:    Diverticulosis    Recommendations: --High fiber diet.      Signed By: Mohd. Derflinger J. Lujean Ebright, MD     01/19/2016  12:18 PM

## 2016-01-19 NOTE — Procedures (Signed)
Colonoscopy Operative Report    Patrick [name]  [id number]  [birth year]      Procedure Type:   Colonoscopy    Indications:  Screen     Operator:  Tonette Ledereravid J. Diaz Crago, MD      Referring Provider: Jabier GaussShuang-Ping Wang, MD      Sedation:  MAC anesthesia      Procedure Details:  After informed consent was obtained with all risks and benefits of procedure explained and preoperative exam completed, the patient was taken to the endoscopy suite and placed in the left lateral decubitus position.  Upon sedation as per above, a digital rectal exam was performed.  The Olympus videocolonoscope  was inserted in the rectum and carefully advanced to the terminal ileum.  The cecum was identified by the ileocecal valve and appendiceal orifice.  The quality of preparation was excellent.  The colonoscope was slowly withdrawn with careful evaluation between folds. Retroflexion in the rectum was completed .     Findings:   Rectum: normal  Sigmoid: moderate diverticulosis;  Descending Colon: mild diverticulosis;  Transverse Colon: normal  Ascending Colon: normal  Cecum: normal  Terminal Ileum: normal      Specimen Removed:  none    Complications: None.     EBL:  None.    Impression:    Diverticulosis    Recommendations: --High fiber diet.      Signed By: Tonette Ledereravid J. Shameeka Silliman, MD     01/19/2016  12:18 PM

## 2016-01-19 NOTE — Op Note (Signed)
[place] [facility]    Name:  Patrick RilyYTELL, Patrick [name]  MR#:  [id number]  DOB:  1945-11-29  Account #:  [id number]  Date of Adm:  01/19/2016      DATE OF PROCEDURE:  01/19/2016    ENDOSCOPIST:  Doreen Beamavid Bueford Arp, MD    PREOPERATIVE DIAGNOSIS: Colon cancer screening.    POSTOPERATIVE DIAGNOSIS: Diverticulosis.    PROCEDURE PERFORMED  1. Colonoscopy.  2. Ileoscopy.    ANESTHESIA: MAC.    ANESTHESIOLOGIST:  Melina SchoolsElizabeth Kao, MD    DESCRIPTION OF PROCEDURE:  Findings: Digital rectal examination  performed, no masses. Terminal ileum, cecum, ascending, and transverse  normal. Descending and sigmoid had diverticulosis. Rectum normal.  Retroflexion, no additional lesions seen. Tolerated well. No  biopsies.    PLAN: To be maintained on fiber.        Rennis HardingELLIS, Kortny Lirette MD   :[id number]  D:  01/19/2016   12:05  T:  01/19/2016   12:37  Job #:  [id number]

## 2016-01-20 MED FILL — XYLOCAINE-MPF 20 MG/ML (2 %) INJECTION SOLUTION: 20 mg/mL (2 %) | INTRAMUSCULAR | Qty: 100

## 2016-01-20 MED FILL — DIPRIVAN 10 MG/ML INTRAVENOUS EMULSION: 10 mg/mL | INTRAVENOUS | Qty: 200

## 2016-01-20 MED FILL — LACTATED RINGERS IV: INTRAVENOUS | Qty: 250

## 2016-02-02 ENCOUNTER — Inpatient Hospital Stay: Admit: 2016-02-02 | Payer: PRIVATE HEALTH INSURANCE | Primary: Internal Medicine

## 2016-02-02 DIAGNOSIS — R609 Edema, unspecified: Secondary | ICD-10-CM

## 2016-02-02 LAB — METABOLIC PANEL, COMPREHENSIVE
A-G Ratio: 0.9 — ABNORMAL LOW (ref 1.0–1.5)
ALT (SGPT): 23 U/L (ref 12–78)
AST (SGOT): 14 U/L — ABNORMAL LOW (ref 15–37)
Albumin: 3.4 g/dL (ref 3.4–5.0)
Alk. phosphatase: 73 U/L (ref 46–116)
Anion gap: 7 mmol/L — ABNORMAL LOW (ref 8–20)
BUN: 26 mg/dL — ABNORMAL HIGH (ref 7–18)
Bilirubin, total: 0.7 mg/dL (ref 0.2–1.0)
CO2: 28 mmol/L (ref 21–32)
Calcium: 8.7 mg/dL (ref 8.5–10.1)
Chloride: 103 mmol/L (ref 98–107)
Creatinine: 1.08 mg/dL (ref 0.70–1.30)
GFR est AA: >60 mL/min/{1.73_m2} (ref >60)
GFR est non-AA: >60 mL/min/{1.73_m2} (ref >60)
Globulin: 4 g/dL (ref 2.5–5.0)
Glucose: 116 mg/dL — ABNORMAL HIGH (ref 74–106)
Potassium: 4 mmol/L (ref 3.5–5.1)
Protein, total: 7.4 g/dL (ref 6.4–8.2)
Sodium: 138 mmol/L (ref 136–145)

## 2016-02-02 LAB — CBC WITH AUTOMATED DIFF
ABS. BASOPHILS: 0 10*3/uL (ref 0.0–0.1)
ABS. EOSINOPHILS: 0.2 10*3/uL (ref 0.0–0.2)
ABS. LYMPHOCYTES: 1.8 10*3/uL (ref 1.0–5.5)
ABS. MONOCYTES: 0.9 10*3/uL (ref 0.1–1.0)
ABS. NEUTROPHILS: 5.6 10*3/uL (ref 2.0–8.1)
BASOPHILS: 1 % (ref 0.0–1.0)
EOSINOPHILS: 2 % (ref 0.0–2.0)
HCT: 42.3 % (ref 42.0–52.0)
HGB: 13.5 g/dL — ABNORMAL LOW (ref 14.0–18.0)
LYMPHOCYTES: 22 % (ref 20.5–51.1)
MCH: 28.8 PG (ref 27.0–31.0)
MCHC: 31.9 g/dL (ref 30.5–36.0)
MCV: 90.4 FL (ref 80.0–96.0)
MONOCYTES: 10 % (ref 1.7–10.0)
MPV: 12 FL (ref 10.2–12.7)
NEUTROPHILS: 65 % (ref 42.2–75.2)
PLATELET: 142 10*3/uL (ref 122–400)
RBC: 4.68 M/uL — ABNORMAL LOW (ref 4.70–6.10)
RDW: 14.9 % — ABNORMAL HIGH (ref 11.4–14.6)
WBC: 8.5 10*3/uL (ref 4.8–10.8)

## 2016-02-02 LAB — LIPID PANEL
CHOL/HDL Ratio: 3.5
Cholesterol, total: 163 mg/dL (ref <200)
HDL Cholesterol: 46 mg/dL (ref 40–60)
LDL, calculated: 102.6 MG/DL (ref 0–130)
Triglyceride: 72 mg/dL (ref <150)
VLDL, calculated: 14.4 MG/DL (ref 3–35)

## 2016-02-02 LAB — TSH 3RD GENERATION: TSH: 1.54 u[IU]/mL (ref 0.360–3.740)

## 2016-04-12 DIAGNOSIS — E876 Hypokalemia: Secondary | ICD-10-CM | POA: Diagnosis not present

## 2016-04-12 DIAGNOSIS — N3281 Overactive bladder: Secondary | ICD-10-CM | POA: Diagnosis not present

## 2016-04-12 DIAGNOSIS — M21371 Foot drop, right foot: Secondary | ICD-10-CM | POA: Diagnosis not present

## 2016-04-12 DIAGNOSIS — M25562 Pain in left knee: Secondary | ICD-10-CM | POA: Diagnosis not present

## 2016-04-12 DIAGNOSIS — Z6841 Body Mass Index (BMI) 40.0 and over, adult: Secondary | ICD-10-CM | POA: Diagnosis not present

## 2016-04-12 DIAGNOSIS — R600 Localized edema: Secondary | ICD-10-CM | POA: Diagnosis not present

## 2016-07-31 DIAGNOSIS — M79652 Pain in left thigh: Secondary | ICD-10-CM | POA: Diagnosis not present

## 2016-07-31 DIAGNOSIS — R29898 Other symptoms and signs involving the musculoskeletal system: Secondary | ICD-10-CM | POA: Diagnosis not present

## 2016-07-31 DIAGNOSIS — Z23 Encounter for immunization: Secondary | ICD-10-CM | POA: Diagnosis not present

## 2016-08-17 DIAGNOSIS — S76102D Unspecified injury of left quadriceps muscle, fascia and tendon, subsequent encounter: Secondary | ICD-10-CM | POA: Diagnosis not present

## 2016-08-17 DIAGNOSIS — M6281 Muscle weakness (generalized): Secondary | ICD-10-CM | POA: Diagnosis not present

## 2016-08-17 DIAGNOSIS — M79652 Pain in left thigh: Secondary | ICD-10-CM | POA: Diagnosis not present

## 2016-08-17 DIAGNOSIS — S81802D Unspecified open wound, left lower leg, subsequent encounter: Secondary | ICD-10-CM | POA: Diagnosis not present

## 2016-08-23 DIAGNOSIS — S76102D Unspecified injury of left quadriceps muscle, fascia and tendon, subsequent encounter: Secondary | ICD-10-CM | POA: Diagnosis not present

## 2016-08-23 DIAGNOSIS — M79652 Pain in left thigh: Secondary | ICD-10-CM | POA: Diagnosis not present

## 2016-08-23 DIAGNOSIS — M6281 Muscle weakness (generalized): Secondary | ICD-10-CM | POA: Diagnosis not present

## 2016-08-23 DIAGNOSIS — S81802D Unspecified open wound, left lower leg, subsequent encounter: Secondary | ICD-10-CM | POA: Diagnosis not present

## 2016-08-24 DIAGNOSIS — M6281 Muscle weakness (generalized): Secondary | ICD-10-CM | POA: Diagnosis not present

## 2016-08-24 DIAGNOSIS — S76102D Unspecified injury of left quadriceps muscle, fascia and tendon, subsequent encounter: Secondary | ICD-10-CM | POA: Diagnosis not present

## 2016-08-24 DIAGNOSIS — M79652 Pain in left thigh: Secondary | ICD-10-CM | POA: Diagnosis not present

## 2016-08-24 DIAGNOSIS — S81802D Unspecified open wound, left lower leg, subsequent encounter: Secondary | ICD-10-CM | POA: Diagnosis not present

## 2016-08-29 DIAGNOSIS — M6281 Muscle weakness (generalized): Secondary | ICD-10-CM | POA: Diagnosis not present

## 2016-08-29 DIAGNOSIS — S76102D Unspecified injury of left quadriceps muscle, fascia and tendon, subsequent encounter: Secondary | ICD-10-CM | POA: Diagnosis not present

## 2016-08-29 DIAGNOSIS — S81802D Unspecified open wound, left lower leg, subsequent encounter: Secondary | ICD-10-CM | POA: Diagnosis not present

## 2016-08-29 DIAGNOSIS — M79652 Pain in left thigh: Secondary | ICD-10-CM | POA: Diagnosis not present

## 2016-08-30 DIAGNOSIS — M79652 Pain in left thigh: Secondary | ICD-10-CM | POA: Diagnosis not present

## 2016-08-30 DIAGNOSIS — S76102D Unspecified injury of left quadriceps muscle, fascia and tendon, subsequent encounter: Secondary | ICD-10-CM | POA: Diagnosis not present

## 2016-08-30 DIAGNOSIS — S81802D Unspecified open wound, left lower leg, subsequent encounter: Secondary | ICD-10-CM | POA: Diagnosis not present

## 2016-08-30 DIAGNOSIS — M6281 Muscle weakness (generalized): Secondary | ICD-10-CM | POA: Diagnosis not present

## 2016-08-31 DIAGNOSIS — S81802D Unspecified open wound, left lower leg, subsequent encounter: Secondary | ICD-10-CM | POA: Diagnosis not present

## 2016-08-31 DIAGNOSIS — M79652 Pain in left thigh: Secondary | ICD-10-CM | POA: Diagnosis not present

## 2016-08-31 DIAGNOSIS — M6281 Muscle weakness (generalized): Secondary | ICD-10-CM | POA: Diagnosis not present

## 2016-08-31 DIAGNOSIS — S76102D Unspecified injury of left quadriceps muscle, fascia and tendon, subsequent encounter: Secondary | ICD-10-CM | POA: Diagnosis not present

## 2016-09-05 DIAGNOSIS — M6281 Muscle weakness (generalized): Secondary | ICD-10-CM | POA: Diagnosis not present

## 2016-09-05 DIAGNOSIS — S81802D Unspecified open wound, left lower leg, subsequent encounter: Secondary | ICD-10-CM | POA: Diagnosis not present

## 2016-09-05 DIAGNOSIS — S76102D Unspecified injury of left quadriceps muscle, fascia and tendon, subsequent encounter: Secondary | ICD-10-CM | POA: Diagnosis not present

## 2016-09-05 DIAGNOSIS — M79652 Pain in left thigh: Secondary | ICD-10-CM | POA: Diagnosis not present

## 2016-09-06 DIAGNOSIS — M79652 Pain in left thigh: Secondary | ICD-10-CM | POA: Diagnosis not present

## 2016-09-06 DIAGNOSIS — S81802D Unspecified open wound, left lower leg, subsequent encounter: Secondary | ICD-10-CM | POA: Diagnosis not present

## 2016-09-06 DIAGNOSIS — S76102D Unspecified injury of left quadriceps muscle, fascia and tendon, subsequent encounter: Secondary | ICD-10-CM | POA: Diagnosis not present

## 2016-09-06 DIAGNOSIS — M6281 Muscle weakness (generalized): Secondary | ICD-10-CM | POA: Diagnosis not present

## 2016-09-08 DIAGNOSIS — S76102D Unspecified injury of left quadriceps muscle, fascia and tendon, subsequent encounter: Secondary | ICD-10-CM | POA: Diagnosis not present

## 2016-09-08 DIAGNOSIS — M79652 Pain in left thigh: Secondary | ICD-10-CM | POA: Diagnosis not present

## 2016-09-08 DIAGNOSIS — M6281 Muscle weakness (generalized): Secondary | ICD-10-CM | POA: Diagnosis not present

## 2016-09-08 DIAGNOSIS — S81802D Unspecified open wound, left lower leg, subsequent encounter: Secondary | ICD-10-CM | POA: Diagnosis not present

## 2016-09-11 DIAGNOSIS — M79652 Pain in left thigh: Secondary | ICD-10-CM | POA: Diagnosis not present

## 2016-09-11 DIAGNOSIS — M6281 Muscle weakness (generalized): Secondary | ICD-10-CM | POA: Diagnosis not present

## 2016-09-11 DIAGNOSIS — S81802D Unspecified open wound, left lower leg, subsequent encounter: Secondary | ICD-10-CM | POA: Diagnosis not present

## 2016-09-11 DIAGNOSIS — S76102D Unspecified injury of left quadriceps muscle, fascia and tendon, subsequent encounter: Secondary | ICD-10-CM | POA: Diagnosis not present

## 2016-09-12 DIAGNOSIS — M79652 Pain in left thigh: Secondary | ICD-10-CM | POA: Diagnosis not present

## 2016-09-12 DIAGNOSIS — S76102D Unspecified injury of left quadriceps muscle, fascia and tendon, subsequent encounter: Secondary | ICD-10-CM | POA: Diagnosis not present

## 2016-09-12 DIAGNOSIS — M6281 Muscle weakness (generalized): Secondary | ICD-10-CM | POA: Diagnosis not present

## 2016-09-12 DIAGNOSIS — S81802D Unspecified open wound, left lower leg, subsequent encounter: Secondary | ICD-10-CM | POA: Diagnosis not present

## 2016-09-14 DIAGNOSIS — S81802D Unspecified open wound, left lower leg, subsequent encounter: Secondary | ICD-10-CM | POA: Diagnosis not present

## 2016-09-14 DIAGNOSIS — M6281 Muscle weakness (generalized): Secondary | ICD-10-CM | POA: Diagnosis not present

## 2016-09-14 DIAGNOSIS — S76102D Unspecified injury of left quadriceps muscle, fascia and tendon, subsequent encounter: Secondary | ICD-10-CM | POA: Diagnosis not present

## 2016-09-14 DIAGNOSIS — M79652 Pain in left thigh: Secondary | ICD-10-CM | POA: Diagnosis not present

## 2016-09-15 DIAGNOSIS — Z0001 Encounter for general adult medical examination with abnormal findings: Secondary | ICD-10-CM | POA: Diagnosis not present

## 2016-09-15 DIAGNOSIS — R600 Localized edema: Secondary | ICD-10-CM | POA: Diagnosis not present

## 2016-09-15 DIAGNOSIS — Z136 Encounter for screening for cardiovascular disorders: Secondary | ICD-10-CM | POA: Diagnosis not present

## 2016-09-15 DIAGNOSIS — B351 Tinea unguium: Secondary | ICD-10-CM | POA: Diagnosis not present

## 2016-09-15 DIAGNOSIS — M21371 Foot drop, right foot: Secondary | ICD-10-CM | POA: Diagnosis not present

## 2016-09-15 DIAGNOSIS — E876 Hypokalemia: Secondary | ICD-10-CM | POA: Diagnosis not present

## 2016-09-15 DIAGNOSIS — N3281 Overactive bladder: Secondary | ICD-10-CM | POA: Diagnosis not present

## 2016-09-15 DIAGNOSIS — Z125 Encounter for screening for malignant neoplasm of prostate: Secondary | ICD-10-CM | POA: Diagnosis not present

## 2016-09-15 DIAGNOSIS — Z13220 Encounter for screening for lipoid disorders: Secondary | ICD-10-CM | POA: Diagnosis not present

## 2016-09-15 DIAGNOSIS — M79652 Pain in left thigh: Secondary | ICD-10-CM | POA: Diagnosis not present

## 2016-09-19 DIAGNOSIS — S76102D Unspecified injury of left quadriceps muscle, fascia and tendon, subsequent encounter: Secondary | ICD-10-CM | POA: Diagnosis not present

## 2016-09-19 DIAGNOSIS — M79652 Pain in left thigh: Secondary | ICD-10-CM | POA: Diagnosis not present

## 2016-09-19 DIAGNOSIS — M6281 Muscle weakness (generalized): Secondary | ICD-10-CM | POA: Diagnosis not present

## 2016-09-19 DIAGNOSIS — S81802D Unspecified open wound, left lower leg, subsequent encounter: Secondary | ICD-10-CM | POA: Diagnosis not present

## 2016-09-21 DIAGNOSIS — S76102D Unspecified injury of left quadriceps muscle, fascia and tendon, subsequent encounter: Secondary | ICD-10-CM | POA: Diagnosis not present

## 2016-09-21 DIAGNOSIS — M6281 Muscle weakness (generalized): Secondary | ICD-10-CM | POA: Diagnosis not present

## 2016-09-21 DIAGNOSIS — S81802D Unspecified open wound, left lower leg, subsequent encounter: Secondary | ICD-10-CM | POA: Diagnosis not present

## 2016-09-21 DIAGNOSIS — M79652 Pain in left thigh: Secondary | ICD-10-CM | POA: Diagnosis not present

## 2016-09-22 DIAGNOSIS — M6281 Muscle weakness (generalized): Secondary | ICD-10-CM | POA: Diagnosis not present

## 2016-09-22 DIAGNOSIS — M79652 Pain in left thigh: Secondary | ICD-10-CM | POA: Diagnosis not present

## 2016-09-22 DIAGNOSIS — S81802D Unspecified open wound, left lower leg, subsequent encounter: Secondary | ICD-10-CM | POA: Diagnosis not present

## 2016-09-22 DIAGNOSIS — S76102D Unspecified injury of left quadriceps muscle, fascia and tendon, subsequent encounter: Secondary | ICD-10-CM | POA: Diagnosis not present

## 2016-09-26 DIAGNOSIS — M79652 Pain in left thigh: Secondary | ICD-10-CM | POA: Diagnosis not present

## 2016-09-26 DIAGNOSIS — M6281 Muscle weakness (generalized): Secondary | ICD-10-CM | POA: Diagnosis not present

## 2016-09-26 DIAGNOSIS — S76102D Unspecified injury of left quadriceps muscle, fascia and tendon, subsequent encounter: Secondary | ICD-10-CM | POA: Diagnosis not present

## 2016-09-26 DIAGNOSIS — S81802D Unspecified open wound, left lower leg, subsequent encounter: Secondary | ICD-10-CM | POA: Diagnosis not present

## 2016-09-27 ENCOUNTER — Ambulatory Visit: Payer: PRIVATE HEALTH INSURANCE | Admitting: Physical Therapy

## 2016-09-27 DIAGNOSIS — I739 Peripheral vascular disease, unspecified: Secondary | ICD-10-CM | POA: Diagnosis not present

## 2016-09-27 DIAGNOSIS — M79671 Pain in right foot: Secondary | ICD-10-CM | POA: Diagnosis not present

## 2016-09-27 DIAGNOSIS — M79672 Pain in left foot: Secondary | ICD-10-CM | POA: Diagnosis not present

## 2016-09-27 DIAGNOSIS — L603 Nail dystrophy: Secondary | ICD-10-CM | POA: Diagnosis not present

## 2016-09-28 DIAGNOSIS — S76102D Unspecified injury of left quadriceps muscle, fascia and tendon, subsequent encounter: Secondary | ICD-10-CM | POA: Diagnosis not present

## 2016-09-28 DIAGNOSIS — M6281 Muscle weakness (generalized): Secondary | ICD-10-CM | POA: Diagnosis not present

## 2016-09-28 DIAGNOSIS — M79652 Pain in left thigh: Secondary | ICD-10-CM | POA: Diagnosis not present

## 2016-09-28 DIAGNOSIS — S81802D Unspecified open wound, left lower leg, subsequent encounter: Secondary | ICD-10-CM | POA: Diagnosis not present

## 2016-10-03 DIAGNOSIS — M6281 Muscle weakness (generalized): Secondary | ICD-10-CM | POA: Diagnosis not present

## 2016-10-03 DIAGNOSIS — S81802D Unspecified open wound, left lower leg, subsequent encounter: Secondary | ICD-10-CM | POA: Diagnosis not present

## 2016-10-03 DIAGNOSIS — M79652 Pain in left thigh: Secondary | ICD-10-CM | POA: Diagnosis not present

## 2016-10-03 DIAGNOSIS — S76102D Unspecified injury of left quadriceps muscle, fascia and tendon, subsequent encounter: Secondary | ICD-10-CM | POA: Diagnosis not present

## 2016-10-05 DIAGNOSIS — M79652 Pain in left thigh: Secondary | ICD-10-CM | POA: Diagnosis not present

## 2016-10-05 DIAGNOSIS — M6281 Muscle weakness (generalized): Secondary | ICD-10-CM | POA: Diagnosis not present

## 2016-10-05 DIAGNOSIS — S81802D Unspecified open wound, left lower leg, subsequent encounter: Secondary | ICD-10-CM | POA: Diagnosis not present

## 2016-10-05 DIAGNOSIS — S76102D Unspecified injury of left quadriceps muscle, fascia and tendon, subsequent encounter: Secondary | ICD-10-CM | POA: Diagnosis not present

## 2016-10-10 DIAGNOSIS — M79652 Pain in left thigh: Secondary | ICD-10-CM | POA: Diagnosis not present

## 2016-10-10 DIAGNOSIS — S81802D Unspecified open wound, left lower leg, subsequent encounter: Secondary | ICD-10-CM | POA: Diagnosis not present

## 2016-10-10 DIAGNOSIS — M6281 Muscle weakness (generalized): Secondary | ICD-10-CM | POA: Diagnosis not present

## 2016-10-10 DIAGNOSIS — S76102D Unspecified injury of left quadriceps muscle, fascia and tendon, subsequent encounter: Secondary | ICD-10-CM | POA: Diagnosis not present

## 2016-10-11 DIAGNOSIS — S76102D Unspecified injury of left quadriceps muscle, fascia and tendon, subsequent encounter: Secondary | ICD-10-CM | POA: Diagnosis not present

## 2016-10-11 DIAGNOSIS — S81802D Unspecified open wound, left lower leg, subsequent encounter: Secondary | ICD-10-CM | POA: Diagnosis not present

## 2016-10-11 DIAGNOSIS — M6281 Muscle weakness (generalized): Secondary | ICD-10-CM | POA: Diagnosis not present

## 2016-10-11 DIAGNOSIS — M79652 Pain in left thigh: Secondary | ICD-10-CM | POA: Diagnosis not present

## 2016-10-12 DIAGNOSIS — M6281 Muscle weakness (generalized): Secondary | ICD-10-CM | POA: Diagnosis not present

## 2016-10-12 DIAGNOSIS — S81802D Unspecified open wound, left lower leg, subsequent encounter: Secondary | ICD-10-CM | POA: Diagnosis not present

## 2016-10-12 DIAGNOSIS — G4733 Obstructive sleep apnea (adult) (pediatric): Secondary | ICD-10-CM | POA: Diagnosis not present

## 2016-10-12 DIAGNOSIS — M79652 Pain in left thigh: Secondary | ICD-10-CM | POA: Diagnosis not present

## 2016-10-12 DIAGNOSIS — S76102D Unspecified injury of left quadriceps muscle, fascia and tendon, subsequent encounter: Secondary | ICD-10-CM | POA: Diagnosis not present

## 2016-10-19 DIAGNOSIS — M4800 Spinal stenosis, site unspecified: Secondary | ICD-10-CM | POA: Diagnosis not present

## 2016-10-19 DIAGNOSIS — S72091S Other fracture of head and neck of right femur, sequela: Secondary | ICD-10-CM | POA: Diagnosis not present

## 2016-10-19 DIAGNOSIS — M25552 Pain in left hip: Secondary | ICD-10-CM | POA: Diagnosis not present

## 2016-10-19 DIAGNOSIS — M8700 Idiopathic aseptic necrosis of unspecified bone: Secondary | ICD-10-CM | POA: Diagnosis not present

## 2016-10-25 DIAGNOSIS — M48061 Spinal stenosis, lumbar region without neurogenic claudication: Secondary | ICD-10-CM | POA: Diagnosis not present

## 2016-10-25 DIAGNOSIS — M25552 Pain in left hip: Secondary | ICD-10-CM | POA: Diagnosis not present

## 2016-10-25 DIAGNOSIS — M8700 Idiopathic aseptic necrosis of unspecified bone: Secondary | ICD-10-CM | POA: Diagnosis not present

## 2016-11-09 DIAGNOSIS — M25552 Pain in left hip: Secondary | ICD-10-CM | POA: Diagnosis not present

## 2016-11-15 DIAGNOSIS — R972 Elevated prostate specific antigen [PSA]: Secondary | ICD-10-CM | POA: Diagnosis not present

## 2016-11-15 DIAGNOSIS — N401 Enlarged prostate with lower urinary tract symptoms: Secondary | ICD-10-CM | POA: Diagnosis not present

## 2016-11-15 DIAGNOSIS — N3941 Urge incontinence: Secondary | ICD-10-CM | POA: Diagnosis not present

## 2016-11-15 DIAGNOSIS — R350 Frequency of micturition: Secondary | ICD-10-CM | POA: Diagnosis not present

## 2016-11-22 DIAGNOSIS — M25552 Pain in left hip: Secondary | ICD-10-CM | POA: Diagnosis not present

## 2016-11-22 DIAGNOSIS — S72091S Other fracture of head and neck of right femur, sequela: Secondary | ICD-10-CM | POA: Diagnosis not present

## 2016-11-22 DIAGNOSIS — M1612 Unilateral primary osteoarthritis, left hip: Secondary | ICD-10-CM | POA: Diagnosis not present

## 2016-11-22 DIAGNOSIS — M8700 Idiopathic aseptic necrosis of unspecified bone: Secondary | ICD-10-CM | POA: Diagnosis not present

## 2016-11-29 DIAGNOSIS — R350 Frequency of micturition: Secondary | ICD-10-CM | POA: Diagnosis not present

## 2016-11-29 DIAGNOSIS — R972 Elevated prostate specific antigen [PSA]: Secondary | ICD-10-CM | POA: Diagnosis not present

## 2016-11-29 DIAGNOSIS — N3941 Urge incontinence: Secondary | ICD-10-CM | POA: Diagnosis not present

## 2016-11-29 DIAGNOSIS — N401 Enlarged prostate with lower urinary tract symptoms: Secondary | ICD-10-CM | POA: Diagnosis not present

## 2016-12-12 DIAGNOSIS — M1612 Unilateral primary osteoarthritis, left hip: Secondary | ICD-10-CM | POA: Diagnosis not present

## 2016-12-12 DIAGNOSIS — E7800 Pure hypercholesterolemia, unspecified: Secondary | ICD-10-CM | POA: Diagnosis not present

## 2016-12-12 DIAGNOSIS — R600 Localized edema: Secondary | ICD-10-CM | POA: Diagnosis not present

## 2016-12-12 DIAGNOSIS — Z6834 Body mass index (BMI) 34.0-34.9, adult: Secondary | ICD-10-CM | POA: Diagnosis not present

## 2016-12-12 DIAGNOSIS — Z79899 Other long term (current) drug therapy: Secondary | ICD-10-CM | POA: Diagnosis not present

## 2016-12-12 DIAGNOSIS — M21371 Foot drop, right foot: Secondary | ICD-10-CM | POA: Diagnosis not present

## 2016-12-12 DIAGNOSIS — Z95828 Presence of other vascular implants and grafts: Secondary | ICD-10-CM | POA: Diagnosis not present

## 2016-12-13 DIAGNOSIS — N401 Enlarged prostate with lower urinary tract symptoms: Secondary | ICD-10-CM | POA: Diagnosis not present

## 2016-12-13 DIAGNOSIS — R972 Elevated prostate specific antigen [PSA]: Secondary | ICD-10-CM | POA: Diagnosis not present

## 2016-12-13 DIAGNOSIS — N3941 Urge incontinence: Secondary | ICD-10-CM | POA: Diagnosis not present

## 2016-12-22 ENCOUNTER — Other Ambulatory Visit: Payer: Self-pay | Admitting: Family Medicine

## 2016-12-22 DIAGNOSIS — Z95828 Presence of other vascular implants and grafts: Secondary | ICD-10-CM

## 2017-01-04 ENCOUNTER — Ambulatory Visit
Admission: RE | Admit: 2017-01-04 | Discharge: 2017-01-04 | Disposition: A | Discharge status: Home / Self Care | Payer: Medicare Other | Source: Ambulatory Visit | Attending: Family Medicine | Admitting: Family Medicine

## 2017-01-04 DIAGNOSIS — Z95828 Presence of other vascular implants and grafts: Secondary | ICD-10-CM

## 2017-01-04 DIAGNOSIS — I2699 Other pulmonary embolism without acute cor pulmonale: Secondary | ICD-10-CM | POA: Diagnosis not present

## 2017-01-04 HISTORY — PX: IR RADIOLOGIST EVAL & MGMT: IMG5224

## 2017-01-04 HISTORY — DX: Pure hypercholesterolemia, unspecified: E78.00

## 2017-01-04 HISTORY — DX: Overactive bladder: N32.81

## 2017-01-04 HISTORY — DX: Hypokalemia: E87.6

## 2017-01-04 HISTORY — DX: Bilateral primary osteoarthritis of knee: M17.0

## 2017-01-04 HISTORY — DX: Foot drop, unspecified foot: M21.379

## 2017-01-04 HISTORY — DX: Morbid (severe) obesity due to excess calories: E66.01

## 2017-01-04 HISTORY — DX: Presence of other vascular implants and grafts: Z95.828

## 2017-01-04 HISTORY — DX: Sleep apnea, unspecified: G47.30

## 2017-01-04 HISTORY — DX: Localized edema: R60.0

## 2017-01-04 HISTORY — DX: Unilateral primary osteoarthritis, left hip: M16.12
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
## 2017-01-09 DIAGNOSIS — G4733 Obstructive sleep apnea (adult) (pediatric): Secondary | ICD-10-CM | POA: Diagnosis not present

## 2017-01-10 DIAGNOSIS — R350 Frequency of micturition: Secondary | ICD-10-CM | POA: Diagnosis not present

## 2017-01-10 DIAGNOSIS — N401 Enlarged prostate with lower urinary tract symptoms: Secondary | ICD-10-CM | POA: Diagnosis not present

## 2017-01-10 DIAGNOSIS — N3941 Urge incontinence: Secondary | ICD-10-CM | POA: Diagnosis not present

## 2017-01-17 DIAGNOSIS — Z23 Encounter for immunization: Secondary | ICD-10-CM | POA: Diagnosis not present

## 2017-01-17 DIAGNOSIS — Z6834 Body mass index (BMI) 34.0-34.9, adult: Secondary | ICD-10-CM | POA: Diagnosis not present

## 2017-01-17 DIAGNOSIS — R600 Localized edema: Secondary | ICD-10-CM | POA: Diagnosis not present

## 2017-01-23 DIAGNOSIS — L603 Nail dystrophy: Secondary | ICD-10-CM | POA: Diagnosis not present

## 2017-01-23 DIAGNOSIS — L97511 Non-pressure chronic ulcer of other part of right foot limited to breakdown of skin: Secondary | ICD-10-CM | POA: Diagnosis not present

## 2017-01-23 DIAGNOSIS — I739 Peripheral vascular disease, unspecified: Secondary | ICD-10-CM | POA: Diagnosis not present

## 2017-03-21 DIAGNOSIS — R972 Elevated prostate specific antigen [PSA]: Secondary | ICD-10-CM | POA: Diagnosis not present

## 2017-03-28 DIAGNOSIS — R972 Elevated prostate specific antigen [PSA]: Secondary | ICD-10-CM | POA: Diagnosis not present

## 2017-03-28 DIAGNOSIS — N401 Enlarged prostate with lower urinary tract symptoms: Secondary | ICD-10-CM | POA: Diagnosis not present

## 2017-03-28 DIAGNOSIS — N3941 Urge incontinence: Secondary | ICD-10-CM | POA: Diagnosis not present

## 2017-03-28 DIAGNOSIS — R350 Frequency of micturition: Secondary | ICD-10-CM | POA: Diagnosis not present

## 2017-03-30 ENCOUNTER — Encounter: Payer: Self-pay | Admitting: Diagnostic Radiology

## 2017-04-24 DIAGNOSIS — I739 Peripheral vascular disease, unspecified: Secondary | ICD-10-CM | POA: Diagnosis not present

## 2017-04-24 DIAGNOSIS — L97521 Non-pressure chronic ulcer of other part of left foot limited to breakdown of skin: Secondary | ICD-10-CM | POA: Diagnosis not present

## 2017-04-24 DIAGNOSIS — L97511 Non-pressure chronic ulcer of other part of right foot limited to breakdown of skin: Secondary | ICD-10-CM | POA: Diagnosis not present

## 2017-04-24 DIAGNOSIS — M6701 Short Achilles tendon (acquired), right ankle: Secondary | ICD-10-CM | POA: Diagnosis not present

## 2017-04-24 DIAGNOSIS — L603 Nail dystrophy: Secondary | ICD-10-CM | POA: Diagnosis not present

## 2017-08-07 DIAGNOSIS — M19071 Primary osteoarthritis, right ankle and foot: Secondary | ICD-10-CM | POA: Diagnosis not present

## 2017-08-07 DIAGNOSIS — D2371 Other benign neoplasm of skin of right lower limb, including hip: Secondary | ICD-10-CM | POA: Diagnosis not present

## 2017-09-11 DIAGNOSIS — R972 Elevated prostate specific antigen [PSA]: Secondary | ICD-10-CM | POA: Diagnosis not present

## 2017-09-19 DIAGNOSIS — R972 Elevated prostate specific antigen [PSA]: Secondary | ICD-10-CM | POA: Diagnosis not present

## 2017-09-19 DIAGNOSIS — N401 Enlarged prostate with lower urinary tract symptoms: Secondary | ICD-10-CM | POA: Diagnosis not present

## 2017-09-19 DIAGNOSIS — R350 Frequency of micturition: Secondary | ICD-10-CM | POA: Diagnosis not present

## 2017-09-19 DIAGNOSIS — N3941 Urge incontinence: Secondary | ICD-10-CM | POA: Diagnosis not present

## 2017-10-19 DIAGNOSIS — R350 Frequency of micturition: Secondary | ICD-10-CM | POA: Diagnosis not present

## 2017-10-19 DIAGNOSIS — N401 Enlarged prostate with lower urinary tract symptoms: Secondary | ICD-10-CM | POA: Diagnosis not present

## 2017-10-19 DIAGNOSIS — N3941 Urge incontinence: Secondary | ICD-10-CM | POA: Diagnosis not present

## 2017-11-27 DIAGNOSIS — I739 Peripheral vascular disease, unspecified: Secondary | ICD-10-CM | POA: Diagnosis not present

## 2017-11-27 DIAGNOSIS — L97511 Non-pressure chronic ulcer of other part of right foot limited to breakdown of skin: Secondary | ICD-10-CM | POA: Diagnosis not present

## 2017-11-27 DIAGNOSIS — L84 Corns and callosities: Secondary | ICD-10-CM | POA: Diagnosis not present

## 2017-11-27 DIAGNOSIS — L603 Nail dystrophy: Secondary | ICD-10-CM | POA: Diagnosis not present

## 2017-12-06 DIAGNOSIS — M21371 Foot drop, right foot: Secondary | ICD-10-CM | POA: Diagnosis not present

## 2017-12-06 DIAGNOSIS — Z79899 Other long term (current) drug therapy: Secondary | ICD-10-CM | POA: Diagnosis not present

## 2017-12-06 DIAGNOSIS — H6123 Impacted cerumen, bilateral: Secondary | ICD-10-CM | POA: Diagnosis not present

## 2017-12-06 DIAGNOSIS — R252 Cramp and spasm: Secondary | ICD-10-CM | POA: Diagnosis not present

## 2017-12-06 DIAGNOSIS — E7800 Pure hypercholesterolemia, unspecified: Secondary | ICD-10-CM | POA: Diagnosis not present

## 2017-12-06 DIAGNOSIS — Z0001 Encounter for general adult medical examination with abnormal findings: Secondary | ICD-10-CM | POA: Diagnosis not present

## 2017-12-06 DIAGNOSIS — R600 Localized edema: Secondary | ICD-10-CM | POA: Diagnosis not present

## 2017-12-06 DIAGNOSIS — Z95828 Presence of other vascular implants and grafts: Secondary | ICD-10-CM | POA: Diagnosis not present

## 2017-12-06 DIAGNOSIS — E876 Hypokalemia: Secondary | ICD-10-CM | POA: Diagnosis not present

## 2017-12-06 DIAGNOSIS — M170 Bilateral primary osteoarthritis of knee: Secondary | ICD-10-CM | POA: Diagnosis not present

## 2018-01-09 DIAGNOSIS — G4733 Obstructive sleep apnea (adult) (pediatric): Secondary | ICD-10-CM | POA: Diagnosis not present

## 2018-02-25 DIAGNOSIS — N3941 Urge incontinence: Secondary | ICD-10-CM | POA: Diagnosis not present

## 2018-02-25 DIAGNOSIS — R350 Frequency of micturition: Secondary | ICD-10-CM | POA: Diagnosis not present

## 2018-02-25 DIAGNOSIS — N401 Enlarged prostate with lower urinary tract symptoms: Secondary | ICD-10-CM | POA: Diagnosis not present

## 2018-02-26 DIAGNOSIS — M21371 Foot drop, right foot: Secondary | ICD-10-CM | POA: Diagnosis not present

## 2018-02-26 DIAGNOSIS — L97511 Non-pressure chronic ulcer of other part of right foot limited to breakdown of skin: Secondary | ICD-10-CM | POA: Diagnosis not present

## 2018-02-26 DIAGNOSIS — M2042 Other hammer toe(s) (acquired), left foot: Secondary | ICD-10-CM | POA: Diagnosis not present

## 2018-02-26 DIAGNOSIS — M2041 Other hammer toe(s) (acquired), right foot: Secondary | ICD-10-CM | POA: Diagnosis not present

## 2018-02-26 DIAGNOSIS — I739 Peripheral vascular disease, unspecified: Secondary | ICD-10-CM | POA: Diagnosis not present

## 2018-02-26 DIAGNOSIS — L84 Corns and callosities: Secondary | ICD-10-CM | POA: Diagnosis not present

## 2018-02-26 DIAGNOSIS — L603 Nail dystrophy: Secondary | ICD-10-CM | POA: Diagnosis not present

## 2018-04-10 DIAGNOSIS — G4733 Obstructive sleep apnea (adult) (pediatric): Secondary | ICD-10-CM | POA: Diagnosis not present

## 2018-06-07 DIAGNOSIS — R600 Localized edema: Secondary | ICD-10-CM | POA: Diagnosis not present

## 2018-06-07 DIAGNOSIS — E7800 Pure hypercholesterolemia, unspecified: Secondary | ICD-10-CM | POA: Diagnosis not present

## 2018-06-07 DIAGNOSIS — R05 Cough: Secondary | ICD-10-CM | POA: Diagnosis not present

## 2018-06-07 DIAGNOSIS — E876 Hypokalemia: Secondary | ICD-10-CM | POA: Diagnosis not present

## 2018-06-07 DIAGNOSIS — Z6836 Body mass index (BMI) 36.0-36.9, adult: Secondary | ICD-10-CM | POA: Diagnosis not present

## 2018-06-07 DIAGNOSIS — M21371 Foot drop, right foot: Secondary | ICD-10-CM | POA: Diagnosis not present

## 2018-06-07 DIAGNOSIS — H6122 Impacted cerumen, left ear: Secondary | ICD-10-CM | POA: Diagnosis not present

## 2018-07-16 DIAGNOSIS — L84 Corns and callosities: Secondary | ICD-10-CM | POA: Diagnosis not present

## 2018-07-16 DIAGNOSIS — L603 Nail dystrophy: Secondary | ICD-10-CM | POA: Diagnosis not present

## 2018-07-16 DIAGNOSIS — I739 Peripheral vascular disease, unspecified: Secondary | ICD-10-CM | POA: Diagnosis not present

## 2018-08-28 DIAGNOSIS — N401 Enlarged prostate with lower urinary tract symptoms: Secondary | ICD-10-CM | POA: Diagnosis not present

## 2018-09-04 DIAGNOSIS — R972 Elevated prostate specific antigen [PSA]: Secondary | ICD-10-CM | POA: Diagnosis not present

## 2018-09-04 DIAGNOSIS — N3941 Urge incontinence: Secondary | ICD-10-CM | POA: Diagnosis not present

## 2018-09-04 DIAGNOSIS — N401 Enlarged prostate with lower urinary tract symptoms: Secondary | ICD-10-CM | POA: Diagnosis not present

## 2018-11-08 DIAGNOSIS — L603 Nail dystrophy: Secondary | ICD-10-CM | POA: Diagnosis not present

## 2018-11-08 DIAGNOSIS — L84 Corns and callosities: Secondary | ICD-10-CM | POA: Diagnosis not present

## 2018-11-08 DIAGNOSIS — I739 Peripheral vascular disease, unspecified: Secondary | ICD-10-CM | POA: Diagnosis not present

## 2018-12-03 DIAGNOSIS — G4733 Obstructive sleep apnea (adult) (pediatric): Secondary | ICD-10-CM | POA: Diagnosis not present

## 2018-12-17 DIAGNOSIS — M21371 Foot drop, right foot: Secondary | ICD-10-CM | POA: Diagnosis not present

## 2018-12-17 DIAGNOSIS — E876 Hypokalemia: Secondary | ICD-10-CM | POA: Diagnosis not present

## 2018-12-17 DIAGNOSIS — Z79899 Other long term (current) drug therapy: Secondary | ICD-10-CM | POA: Diagnosis not present

## 2018-12-17 DIAGNOSIS — H6122 Impacted cerumen, left ear: Secondary | ICD-10-CM | POA: Diagnosis not present

## 2018-12-17 DIAGNOSIS — R600 Localized edema: Secondary | ICD-10-CM | POA: Diagnosis not present

## 2018-12-17 DIAGNOSIS — M170 Bilateral primary osteoarthritis of knee: Secondary | ICD-10-CM | POA: Diagnosis not present

## 2018-12-17 DIAGNOSIS — E7800 Pure hypercholesterolemia, unspecified: Secondary | ICD-10-CM | POA: Diagnosis not present

## 2018-12-17 DIAGNOSIS — Z95828 Presence of other vascular implants and grafts: Secondary | ICD-10-CM | POA: Diagnosis not present

## 2018-12-17 DIAGNOSIS — Z0001 Encounter for general adult medical examination with abnormal findings: Secondary | ICD-10-CM | POA: Diagnosis not present

## 2018-12-17 DIAGNOSIS — N3281 Overactive bladder: Secondary | ICD-10-CM | POA: Diagnosis not present

## 2019-03-20 DIAGNOSIS — M25512 Pain in left shoulder: Secondary | ICD-10-CM | POA: Diagnosis not present

## 2019-03-20 DIAGNOSIS — M79602 Pain in left arm: Secondary | ICD-10-CM | POA: Diagnosis not present

## 2019-03-28 DIAGNOSIS — M25512 Pain in left shoulder: Secondary | ICD-10-CM | POA: Diagnosis not present

## 2019-04-23 DIAGNOSIS — G4733 Obstructive sleep apnea (adult) (pediatric): Secondary | ICD-10-CM | POA: Diagnosis not present

## 2019-06-20 DIAGNOSIS — E7800 Pure hypercholesterolemia, unspecified: Secondary | ICD-10-CM | POA: Diagnosis not present

## 2019-06-20 DIAGNOSIS — M170 Bilateral primary osteoarthritis of knee: Secondary | ICD-10-CM | POA: Diagnosis not present

## 2019-06-20 DIAGNOSIS — R600 Localized edema: Secondary | ICD-10-CM | POA: Diagnosis not present

## 2019-06-20 DIAGNOSIS — E876 Hypokalemia: Secondary | ICD-10-CM | POA: Diagnosis not present

## 2019-09-10 DIAGNOSIS — R972 Elevated prostate specific antigen [PSA]: Secondary | ICD-10-CM | POA: Diagnosis not present

## 2019-09-10 DIAGNOSIS — R350 Frequency of micturition: Secondary | ICD-10-CM | POA: Diagnosis not present

## 2019-09-10 DIAGNOSIS — N401 Enlarged prostate with lower urinary tract symptoms: Secondary | ICD-10-CM | POA: Diagnosis not present

## 2019-09-10 DIAGNOSIS — N3941 Urge incontinence: Secondary | ICD-10-CM | POA: Diagnosis not present

## 2019-11-28 DIAGNOSIS — L97521 Non-pressure chronic ulcer of other part of left foot limited to breakdown of skin: Secondary | ICD-10-CM | POA: Diagnosis not present

## 2019-11-28 DIAGNOSIS — I739 Peripheral vascular disease, unspecified: Secondary | ICD-10-CM | POA: Diagnosis not present

## 2019-11-28 DIAGNOSIS — L84 Corns and callosities: Secondary | ICD-10-CM | POA: Diagnosis not present

## 2019-11-28 DIAGNOSIS — L603 Nail dystrophy: Secondary | ICD-10-CM | POA: Diagnosis not present

## 2019-12-26 DIAGNOSIS — E876 Hypokalemia: Secondary | ICD-10-CM | POA: Diagnosis not present

## 2019-12-26 DIAGNOSIS — Z79899 Other long term (current) drug therapy: Secondary | ICD-10-CM | POA: Diagnosis not present

## 2019-12-26 DIAGNOSIS — E7800 Pure hypercholesterolemia, unspecified: Secondary | ICD-10-CM | POA: Diagnosis not present

## 2019-12-26 DIAGNOSIS — M170 Bilateral primary osteoarthritis of knee: Secondary | ICD-10-CM | POA: Diagnosis not present

## 2019-12-26 DIAGNOSIS — M21371 Foot drop, right foot: Secondary | ICD-10-CM | POA: Diagnosis not present

## 2019-12-26 DIAGNOSIS — R600 Localized edema: Secondary | ICD-10-CM | POA: Diagnosis not present

## 2019-12-26 DIAGNOSIS — Z0001 Encounter for general adult medical examination with abnormal findings: Secondary | ICD-10-CM | POA: Diagnosis not present

## 2020-02-27 DIAGNOSIS — L84 Corns and callosities: Secondary | ICD-10-CM | POA: Diagnosis not present

## 2020-02-27 DIAGNOSIS — L603 Nail dystrophy: Secondary | ICD-10-CM | POA: Diagnosis not present

## 2020-02-27 DIAGNOSIS — I739 Peripheral vascular disease, unspecified: Secondary | ICD-10-CM | POA: Diagnosis not present

## 2020-04-28 DIAGNOSIS — G4733 Obstructive sleep apnea (adult) (pediatric): Secondary | ICD-10-CM | POA: Diagnosis not present

## 2020-06-07 DIAGNOSIS — L603 Nail dystrophy: Secondary | ICD-10-CM | POA: Diagnosis not present

## 2020-06-07 DIAGNOSIS — L97511 Non-pressure chronic ulcer of other part of right foot limited to breakdown of skin: Secondary | ICD-10-CM | POA: Diagnosis not present

## 2020-06-07 DIAGNOSIS — L84 Corns and callosities: Secondary | ICD-10-CM | POA: Diagnosis not present

## 2020-06-07 DIAGNOSIS — I739 Peripheral vascular disease, unspecified: Secondary | ICD-10-CM | POA: Diagnosis not present

## 2020-06-23 DIAGNOSIS — M79675 Pain in left toe(s): Secondary | ICD-10-CM | POA: Diagnosis not present

## 2020-06-23 DIAGNOSIS — B351 Tinea unguium: Secondary | ICD-10-CM | POA: Diagnosis not present

## 2020-06-23 DIAGNOSIS — L600 Ingrowing nail: Secondary | ICD-10-CM | POA: Diagnosis not present

## 2020-06-23 DIAGNOSIS — M79674 Pain in right toe(s): Secondary | ICD-10-CM | POA: Diagnosis not present

## 2020-07-08 DIAGNOSIS — Z79899 Other long term (current) drug therapy: Secondary | ICD-10-CM | POA: Diagnosis not present

## 2020-07-08 DIAGNOSIS — E7800 Pure hypercholesterolemia, unspecified: Secondary | ICD-10-CM | POA: Diagnosis not present

## 2020-07-08 DIAGNOSIS — M21371 Foot drop, right foot: Secondary | ICD-10-CM | POA: Diagnosis not present

## 2020-07-08 DIAGNOSIS — R600 Localized edema: Secondary | ICD-10-CM | POA: Diagnosis not present

## 2020-07-08 DIAGNOSIS — R7309 Other abnormal glucose: Secondary | ICD-10-CM | POA: Diagnosis not present

## 2020-07-08 DIAGNOSIS — Z95828 Presence of other vascular implants and grafts: Secondary | ICD-10-CM | POA: Diagnosis not present

## 2020-07-08 DIAGNOSIS — E876 Hypokalemia: Secondary | ICD-10-CM | POA: Diagnosis not present

## 2020-07-08 DIAGNOSIS — M170 Bilateral primary osteoarthritis of knee: Secondary | ICD-10-CM | POA: Diagnosis not present

## 2020-07-19 DIAGNOSIS — Z23 Encounter for immunization: Secondary | ICD-10-CM | POA: Diagnosis not present

## 2020-08-02 DIAGNOSIS — M79675 Pain in left toe(s): Secondary | ICD-10-CM | POA: Diagnosis not present

## 2020-08-02 DIAGNOSIS — M79674 Pain in right toe(s): Secondary | ICD-10-CM | POA: Diagnosis not present

## 2020-08-02 DIAGNOSIS — L600 Ingrowing nail: Secondary | ICD-10-CM | POA: Diagnosis not present

## 2020-08-02 DIAGNOSIS — B351 Tinea unguium: Secondary | ICD-10-CM | POA: Diagnosis not present

## 2020-08-20 DIAGNOSIS — L97522 Non-pressure chronic ulcer of other part of left foot with fat layer exposed: Secondary | ICD-10-CM | POA: Diagnosis not present

## 2020-08-30 DIAGNOSIS — R972 Elevated prostate specific antigen [PSA]: Secondary | ICD-10-CM | POA: Diagnosis not present

## 2020-09-02 DIAGNOSIS — L97522 Non-pressure chronic ulcer of other part of left foot with fat layer exposed: Secondary | ICD-10-CM | POA: Diagnosis not present

## 2020-09-02 DIAGNOSIS — L600 Ingrowing nail: Secondary | ICD-10-CM | POA: Diagnosis not present

## 2020-09-02 DIAGNOSIS — M79675 Pain in left toe(s): Secondary | ICD-10-CM | POA: Diagnosis not present

## 2020-09-02 DIAGNOSIS — B351 Tinea unguium: Secondary | ICD-10-CM | POA: Diagnosis not present

## 2020-09-02 DIAGNOSIS — M79674 Pain in right toe(s): Secondary | ICD-10-CM | POA: Diagnosis not present

## 2020-09-06 DIAGNOSIS — R350 Frequency of micturition: Secondary | ICD-10-CM | POA: Diagnosis not present

## 2020-09-06 DIAGNOSIS — R972 Elevated prostate specific antigen [PSA]: Secondary | ICD-10-CM | POA: Diagnosis not present

## 2020-09-06 DIAGNOSIS — N3941 Urge incontinence: Secondary | ICD-10-CM | POA: Diagnosis not present

## 2020-09-06 DIAGNOSIS — N401 Enlarged prostate with lower urinary tract symptoms: Secondary | ICD-10-CM | POA: Diagnosis not present

## 2020-10-07 DIAGNOSIS — L97522 Non-pressure chronic ulcer of other part of left foot with fat layer exposed: Secondary | ICD-10-CM | POA: Diagnosis not present

## 2020-10-07 DIAGNOSIS — M79674 Pain in right toe(s): Secondary | ICD-10-CM | POA: Diagnosis not present

## 2020-10-07 DIAGNOSIS — L600 Ingrowing nail: Secondary | ICD-10-CM | POA: Diagnosis not present

## 2020-10-07 DIAGNOSIS — B351 Tinea unguium: Secondary | ICD-10-CM | POA: Diagnosis not present

## 2020-10-07 DIAGNOSIS — M79675 Pain in left toe(s): Secondary | ICD-10-CM | POA: Diagnosis not present

## 2020-10-21 DIAGNOSIS — L97522 Non-pressure chronic ulcer of other part of left foot with fat layer exposed: Secondary | ICD-10-CM | POA: Diagnosis not present

## 2020-11-10 DIAGNOSIS — L97521 Non-pressure chronic ulcer of other part of left foot limited to breakdown of skin: Secondary | ICD-10-CM | POA: Diagnosis not present

## 2020-12-10 DIAGNOSIS — L97522 Non-pressure chronic ulcer of other part of left foot with fat layer exposed: Secondary | ICD-10-CM | POA: Diagnosis not present

## 2020-12-24 DIAGNOSIS — M24575 Contracture, left foot: Secondary | ICD-10-CM | POA: Diagnosis not present

## 2020-12-30 DIAGNOSIS — M21371 Foot drop, right foot: Secondary | ICD-10-CM | POA: Diagnosis not present

## 2020-12-30 DIAGNOSIS — Z0001 Encounter for general adult medical examination with abnormal findings: Secondary | ICD-10-CM | POA: Diagnosis not present

## 2020-12-30 DIAGNOSIS — E876 Hypokalemia: Secondary | ICD-10-CM | POA: Diagnosis not present

## 2020-12-30 DIAGNOSIS — M25512 Pain in left shoulder: Secondary | ICD-10-CM | POA: Diagnosis not present

## 2020-12-30 DIAGNOSIS — Z79899 Other long term (current) drug therapy: Secondary | ICD-10-CM | POA: Diagnosis not present

## 2020-12-30 DIAGNOSIS — R7309 Other abnormal glucose: Secondary | ICD-10-CM | POA: Diagnosis not present

## 2020-12-30 DIAGNOSIS — R600 Localized edema: Secondary | ICD-10-CM | POA: Diagnosis not present

## 2020-12-30 DIAGNOSIS — M170 Bilateral primary osteoarthritis of knee: Secondary | ICD-10-CM | POA: Diagnosis not present

## 2020-12-30 DIAGNOSIS — E7800 Pure hypercholesterolemia, unspecified: Secondary | ICD-10-CM | POA: Diagnosis not present

## 2020-12-30 DIAGNOSIS — Z95828 Presence of other vascular implants and grafts: Secondary | ICD-10-CM | POA: Diagnosis not present

## 2020-12-30 DIAGNOSIS — M79652 Pain in left thigh: Secondary | ICD-10-CM | POA: Diagnosis not present

## 2021-01-12 DIAGNOSIS — M6281 Muscle weakness (generalized): Secondary | ICD-10-CM | POA: Diagnosis not present

## 2021-01-12 DIAGNOSIS — M25552 Pain in left hip: Secondary | ICD-10-CM | POA: Diagnosis not present

## 2021-01-12 DIAGNOSIS — M25512 Pain in left shoulder: Secondary | ICD-10-CM | POA: Diagnosis not present

## 2021-01-12 DIAGNOSIS — R262 Difficulty in walking, not elsewhere classified: Secondary | ICD-10-CM | POA: Diagnosis not present

## 2021-01-14 DIAGNOSIS — L84 Corns and callosities: Secondary | ICD-10-CM | POA: Diagnosis not present

## 2021-01-14 DIAGNOSIS — L603 Nail dystrophy: Secondary | ICD-10-CM | POA: Diagnosis not present

## 2021-01-14 DIAGNOSIS — I739 Peripheral vascular disease, unspecified: Secondary | ICD-10-CM | POA: Diagnosis not present

## 2021-01-19 DIAGNOSIS — G4733 Obstructive sleep apnea (adult) (pediatric): Secondary | ICD-10-CM | POA: Diagnosis not present

## 2021-01-21 DIAGNOSIS — R262 Difficulty in walking, not elsewhere classified: Secondary | ICD-10-CM | POA: Diagnosis not present

## 2021-01-21 DIAGNOSIS — M25512 Pain in left shoulder: Secondary | ICD-10-CM | POA: Diagnosis not present

## 2021-01-21 DIAGNOSIS — M6281 Muscle weakness (generalized): Secondary | ICD-10-CM | POA: Diagnosis not present

## 2021-01-21 DIAGNOSIS — M25552 Pain in left hip: Secondary | ICD-10-CM | POA: Diagnosis not present

## 2021-01-26 DIAGNOSIS — M6281 Muscle weakness (generalized): Secondary | ICD-10-CM | POA: Diagnosis not present

## 2021-01-26 DIAGNOSIS — R262 Difficulty in walking, not elsewhere classified: Secondary | ICD-10-CM | POA: Diagnosis not present

## 2021-01-26 DIAGNOSIS — M25512 Pain in left shoulder: Secondary | ICD-10-CM | POA: Diagnosis not present

## 2021-01-26 DIAGNOSIS — M25552 Pain in left hip: Secondary | ICD-10-CM | POA: Diagnosis not present

## 2021-01-27 DIAGNOSIS — Z23 Encounter for immunization: Secondary | ICD-10-CM | POA: Diagnosis not present

## 2021-01-28 DIAGNOSIS — M25552 Pain in left hip: Secondary | ICD-10-CM | POA: Diagnosis not present

## 2021-01-28 DIAGNOSIS — M25512 Pain in left shoulder: Secondary | ICD-10-CM | POA: Diagnosis not present

## 2021-01-28 DIAGNOSIS — R262 Difficulty in walking, not elsewhere classified: Secondary | ICD-10-CM | POA: Diagnosis not present

## 2021-01-28 DIAGNOSIS — M6281 Muscle weakness (generalized): Secondary | ICD-10-CM | POA: Diagnosis not present

## 2021-01-30 ENCOUNTER — Other Ambulatory Visit: Payer: Self-pay

## 2021-01-30 ENCOUNTER — Emergency Department (HOSPITAL_COMMUNITY)
Admission: EM | Admit: 2021-01-30 | Discharge: 2021-01-31 | Disposition: A | Discharge status: Home / Self Care | Payer: Medicare Other | Attending: Emergency Medicine | Admitting: Emergency Medicine

## 2021-01-30 DIAGNOSIS — R197 Diarrhea, unspecified: Secondary | ICD-10-CM | POA: Insufficient documentation

## 2021-01-30 DIAGNOSIS — N400 Enlarged prostate without lower urinary tract symptoms: Secondary | ICD-10-CM | POA: Diagnosis not present

## 2021-01-30 DIAGNOSIS — R1032 Left lower quadrant pain: Secondary | ICD-10-CM | POA: Insufficient documentation

## 2021-01-30 DIAGNOSIS — K567 Ileus, unspecified: Secondary | ICD-10-CM | POA: Diagnosis not present

## 2021-01-30 DIAGNOSIS — K8020 Calculus of gallbladder without cholecystitis without obstruction: Secondary | ICD-10-CM | POA: Diagnosis not present

## 2021-01-30 DIAGNOSIS — R112 Nausea with vomiting, unspecified: Secondary | ICD-10-CM | POA: Insufficient documentation

## 2021-01-30 DIAGNOSIS — N281 Cyst of kidney, acquired: Secondary | ICD-10-CM | POA: Diagnosis not present

## 2021-01-30 DIAGNOSIS — R1111 Vomiting without nausea: Secondary | ICD-10-CM | POA: Diagnosis not present
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
## 2021-01-31 ENCOUNTER — Emergency Department (HOSPITAL_COMMUNITY): Payer: Medicare Other

## 2021-01-31 ENCOUNTER — Encounter (HOSPITAL_COMMUNITY): Payer: Self-pay | Admitting: Student

## 2021-01-31 DIAGNOSIS — R112 Nausea with vomiting, unspecified: Secondary | ICD-10-CM | POA: Diagnosis not present

## 2021-01-31 DIAGNOSIS — N281 Cyst of kidney, acquired: Secondary | ICD-10-CM | POA: Diagnosis not present

## 2021-01-31 DIAGNOSIS — Z743 Need for continuous supervision: Secondary | ICD-10-CM | POA: Diagnosis not present

## 2021-01-31 DIAGNOSIS — R1111 Vomiting without nausea: Secondary | ICD-10-CM | POA: Diagnosis not present

## 2021-01-31 DIAGNOSIS — K8020 Calculus of gallbladder without cholecystitis without obstruction: Secondary | ICD-10-CM | POA: Diagnosis not present

## 2021-01-31 DIAGNOSIS — N400 Enlarged prostate without lower urinary tract symptoms: Secondary | ICD-10-CM | POA: Diagnosis not present

## 2021-01-31 DIAGNOSIS — K567 Ileus, unspecified: Secondary | ICD-10-CM | POA: Diagnosis not present

## 2021-01-31 DIAGNOSIS — R110 Nausea: Secondary | ICD-10-CM | POA: Diagnosis not present

## 2021-01-31 DIAGNOSIS — R279 Unspecified lack of coordination: Secondary | ICD-10-CM | POA: Diagnosis not present

## 2021-01-31 LAB — CBC WITH DIFFERENTIAL/PLATELET
Abs Immature Granulocytes: 0.04 10*3/uL (ref 0.00–0.07)
Basophils Absolute: 0 10*3/uL (ref 0.0–0.1)
Basophils Relative: 0 %
Eosinophils Absolute: 0 10*3/uL (ref 0.0–0.5)
Eosinophils Relative: 0 %
HCT: 46.7 % (ref 39.0–52.0)
Hemoglobin: 15.2 g/dL (ref 13.0–17.0)
Immature Granulocytes: 0 %
Lymphocytes Relative: 3 %
Lymphs Abs: 0.4 10*3/uL — ABNORMAL LOW (ref 0.7–4.0)
MCH: 28.9 pg (ref 26.0–34.0)
MCHC: 32.5 g/dL (ref 30.0–36.0)
MCV: 88.8 fL (ref 80.0–100.0)
Monocytes Absolute: 0.9 10*3/uL (ref 0.1–1.0)
Monocytes Relative: 6 %
Neutro Abs: 13 10*3/uL — ABNORMAL HIGH (ref 1.7–7.7)
Neutrophils Relative %: 91 %
Platelets: 150 10*3/uL (ref 150–400)
RBC: 5.26 MIL/uL (ref 4.22–5.81)
RDW: 14.4 % (ref 11.5–15.5)
WBC: 14.3 10*3/uL — ABNORMAL HIGH (ref 4.0–10.5)
nRBC: 0 % (ref 0.0–0.2)

## 2021-01-31 LAB — COMPREHENSIVE METABOLIC PANEL
ALT: 18 U/L (ref 0–44)
AST: 19 U/L (ref 15–41)
Albumin: 3.7 g/dL (ref 3.5–5.0)
Alkaline Phosphatase: 69 U/L (ref 38–126)
Anion gap: 10 (ref 5–15)
BUN: 24 mg/dL — ABNORMAL HIGH (ref 8–23)
CO2: 23 mmol/L (ref 22–32)
Calcium: 8.8 mg/dL — ABNORMAL LOW (ref 8.9–10.3)
Chloride: 104 mmol/L (ref 98–111)
Creatinine, Ser: 0.8 mg/dL (ref 0.61–1.24)
GFR, Estimated: >60 mL/min (ref >60)
Glucose, Bld: 149 mg/dL — ABNORMAL HIGH (ref 70–99)
Potassium: 3.9 mmol/L (ref 3.5–5.1)
Sodium: 137 mmol/L (ref 135–145)
Total Bilirubin: 1 mg/dL (ref 0.3–1.2)
Total Protein: 7.2 g/dL (ref 6.5–8.1)

## 2021-01-31 LAB — LIPASE, BLOOD: Lipase: 24 U/L (ref 11–51)

## 2021-01-31 MED ORDER — IOHEXOL 300 MG/ML  SOLN
100.0000 mL | Freq: Once | INTRAMUSCULAR | Status: AC | PRN
  Administered 2021-01-31: 100 mL via INTRAVENOUS

## 2021-01-31 MED ORDER — ONDANSETRON 4 MG PO TBDP: 4.0000 mg | ORAL_TABLET | Freq: Three times a day (TID) | ORAL | 0 refills | Status: DC | PRN

## 2021-01-31 MED ORDER — ONDANSETRON HCL 4 MG/2ML IJ SOLN
4.0000 mg | Freq: Once | INTRAMUSCULAR | Status: AC
  Administered 2021-01-31: 4 mg via INTRAVENOUS
  Filled 2021-01-31: qty 2

## 2021-01-31 MED ORDER — SODIUM CHLORIDE 0.9 % IV BOLUS
500.0000 mL | Freq: Once | INTRAVENOUS | Status: AC
  Administered 2021-01-31: 500 mL via INTRAVENOUS

## 2021-01-31 NOTE — Discharge Instructions (Addendum)
You were seen in the emergency department this evening for vomiting, diarrhea, and abdominal discomfort.  Your work-up was overall reassuring.  Your CT scan showed findings that may represent an ileus, you were also found to have some enlargement of your prostate and gallstones.  We would like you to follow a clear liquid diet over the next 24 hours and slowly advance your diet.  We are sending home with Zofran to take every 8 hours as needed for nausea and vomiting.  We have prescribed you new medication(s) today. Discuss the medications prescribed today with your pharmacist as they can have adverse effects and interactions with your other medicines including over the counter and prescribed medications. Seek medical evaluation if you start to experience new or abnormal symptoms after taking one of these medicines, seek care immediately if you start to experience difficulty breathing, feeling of your throat closing, facial swelling, or rash as these could be indications of a more serious allergic reaction  Please follow-up with primary care, call first thing today to schedule appointment for soon as possible.  Please have your blood pressure rechecked at your follow-up appointment as it was elevated in the ER.  Return to the emergency department for new or worsening symptoms including but not limited to new or worsening pain, inability to keep fluids down, blood in your stool or vomit, not having a bowel movement and especially not passing gas, passing out, chest pain, trouble breathing, fever, or any other concerns

## 2021-01-31 NOTE — ED Notes (Signed)
Patient did not vomit during admission A&Ox4 and cleared for discharged. Patient is waiting for  ptar to take him home. AVS reviewed no concerns at this time.

## 2021-01-31 NOTE — ED Notes (Signed)
[facility] called  
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
## 2021-01-31 NOTE — ED Notes (Signed)
Patient discharged with [facility]

## 2021-02-03 DIAGNOSIS — L97521 Non-pressure chronic ulcer of other part of left foot limited to breakdown of skin: Secondary | ICD-10-CM | POA: Diagnosis not present

## 2021-02-04 DIAGNOSIS — N281 Cyst of kidney, acquired: Secondary | ICD-10-CM | POA: Diagnosis not present

## 2021-02-04 DIAGNOSIS — R197 Diarrhea, unspecified: Secondary | ICD-10-CM | POA: Diagnosis not present

## 2021-02-04 DIAGNOSIS — Z95828 Presence of other vascular implants and grafts: Secondary | ICD-10-CM | POA: Diagnosis not present

## 2021-02-04 DIAGNOSIS — D72828 Other elevated white blood cell count: Secondary | ICD-10-CM | POA: Diagnosis not present

## 2021-02-07 DIAGNOSIS — M25512 Pain in left shoulder: Secondary | ICD-10-CM | POA: Diagnosis not present

## 2021-02-07 DIAGNOSIS — R262 Difficulty in walking, not elsewhere classified: Secondary | ICD-10-CM | POA: Diagnosis not present

## 2021-02-07 DIAGNOSIS — M25552 Pain in left hip: Secondary | ICD-10-CM | POA: Diagnosis not present

## 2021-02-07 DIAGNOSIS — M6281 Muscle weakness (generalized): Secondary | ICD-10-CM | POA: Diagnosis not present

## 2021-02-09 DIAGNOSIS — M6281 Muscle weakness (generalized): Secondary | ICD-10-CM | POA: Diagnosis not present

## 2021-02-09 DIAGNOSIS — R262 Difficulty in walking, not elsewhere classified: Secondary | ICD-10-CM | POA: Diagnosis not present

## 2021-02-09 DIAGNOSIS — M25552 Pain in left hip: Secondary | ICD-10-CM | POA: Diagnosis not present

## 2021-02-09 DIAGNOSIS — M25512 Pain in left shoulder: Secondary | ICD-10-CM | POA: Diagnosis not present

## 2021-02-14 DIAGNOSIS — M25552 Pain in left hip: Secondary | ICD-10-CM | POA: Diagnosis not present

## 2021-02-14 DIAGNOSIS — M25512 Pain in left shoulder: Secondary | ICD-10-CM | POA: Diagnosis not present

## 2021-02-14 DIAGNOSIS — R262 Difficulty in walking, not elsewhere classified: Secondary | ICD-10-CM | POA: Diagnosis not present

## 2021-02-14 DIAGNOSIS — M6281 Muscle weakness (generalized): Secondary | ICD-10-CM | POA: Diagnosis not present

## 2021-02-16 DIAGNOSIS — M6281 Muscle weakness (generalized): Secondary | ICD-10-CM | POA: Diagnosis not present

## 2021-02-16 DIAGNOSIS — R262 Difficulty in walking, not elsewhere classified: Secondary | ICD-10-CM | POA: Diagnosis not present

## 2021-02-16 DIAGNOSIS — M25552 Pain in left hip: Secondary | ICD-10-CM | POA: Diagnosis not present

## 2021-02-16 DIAGNOSIS — M25512 Pain in left shoulder: Secondary | ICD-10-CM | POA: Diagnosis not present

## 2021-02-21 DIAGNOSIS — M25512 Pain in left shoulder: Secondary | ICD-10-CM | POA: Diagnosis not present

## 2021-02-21 DIAGNOSIS — M25552 Pain in left hip: Secondary | ICD-10-CM | POA: Diagnosis not present

## 2021-02-21 DIAGNOSIS — R262 Difficulty in walking, not elsewhere classified: Secondary | ICD-10-CM | POA: Diagnosis not present

## 2021-02-21 DIAGNOSIS — M6281 Muscle weakness (generalized): Secondary | ICD-10-CM | POA: Diagnosis not present

## 2021-02-23 DIAGNOSIS — M6281 Muscle weakness (generalized): Secondary | ICD-10-CM | POA: Diagnosis not present

## 2021-02-23 DIAGNOSIS — M25552 Pain in left hip: Secondary | ICD-10-CM | POA: Diagnosis not present

## 2021-02-23 DIAGNOSIS — R262 Difficulty in walking, not elsewhere classified: Secondary | ICD-10-CM | POA: Diagnosis not present

## 2021-02-23 DIAGNOSIS — M25512 Pain in left shoulder: Secondary | ICD-10-CM | POA: Diagnosis not present

## 2021-02-28 DIAGNOSIS — M25552 Pain in left hip: Secondary | ICD-10-CM | POA: Diagnosis not present

## 2021-02-28 DIAGNOSIS — M25512 Pain in left shoulder: Secondary | ICD-10-CM | POA: Diagnosis not present

## 2021-02-28 DIAGNOSIS — M6281 Muscle weakness (generalized): Secondary | ICD-10-CM | POA: Diagnosis not present

## 2021-02-28 DIAGNOSIS — R262 Difficulty in walking, not elsewhere classified: Secondary | ICD-10-CM | POA: Diagnosis not present

## 2021-03-02 DIAGNOSIS — M25552 Pain in left hip: Secondary | ICD-10-CM | POA: Diagnosis not present

## 2021-03-02 DIAGNOSIS — M25512 Pain in left shoulder: Secondary | ICD-10-CM | POA: Diagnosis not present

## 2021-03-02 DIAGNOSIS — M6281 Muscle weakness (generalized): Secondary | ICD-10-CM | POA: Diagnosis not present

## 2021-03-02 DIAGNOSIS — R262 Difficulty in walking, not elsewhere classified: Secondary | ICD-10-CM | POA: Diagnosis not present

## 2021-03-07 DIAGNOSIS — M6281 Muscle weakness (generalized): Secondary | ICD-10-CM | POA: Diagnosis not present

## 2021-03-07 DIAGNOSIS — M25512 Pain in left shoulder: Secondary | ICD-10-CM | POA: Diagnosis not present

## 2021-03-07 DIAGNOSIS — R262 Difficulty in walking, not elsewhere classified: Secondary | ICD-10-CM | POA: Diagnosis not present

## 2021-03-07 DIAGNOSIS — M25552 Pain in left hip: Secondary | ICD-10-CM | POA: Diagnosis not present

## 2021-03-09 DIAGNOSIS — M6281 Muscle weakness (generalized): Secondary | ICD-10-CM | POA: Diagnosis not present

## 2021-03-09 DIAGNOSIS — M25552 Pain in left hip: Secondary | ICD-10-CM | POA: Diagnosis not present

## 2021-03-09 DIAGNOSIS — R262 Difficulty in walking, not elsewhere classified: Secondary | ICD-10-CM | POA: Diagnosis not present

## 2021-03-09 DIAGNOSIS — M25512 Pain in left shoulder: Secondary | ICD-10-CM | POA: Diagnosis not present

## 2021-03-10 DIAGNOSIS — Z95828 Presence of other vascular implants and grafts: Secondary | ICD-10-CM | POA: Diagnosis not present

## 2021-03-10 DIAGNOSIS — D696 Thrombocytopenia, unspecified: Secondary | ICD-10-CM | POA: Diagnosis not present

## 2021-03-16 DIAGNOSIS — R262 Difficulty in walking, not elsewhere classified: Secondary | ICD-10-CM | POA: Diagnosis not present

## 2021-03-16 DIAGNOSIS — M25512 Pain in left shoulder: Secondary | ICD-10-CM | POA: Diagnosis not present

## 2021-03-16 DIAGNOSIS — M25552 Pain in left hip: Secondary | ICD-10-CM | POA: Diagnosis not present

## 2021-03-16 DIAGNOSIS — M6281 Muscle weakness (generalized): Secondary | ICD-10-CM | POA: Diagnosis not present

## 2021-03-18 DIAGNOSIS — M6281 Muscle weakness (generalized): Secondary | ICD-10-CM | POA: Diagnosis not present

## 2021-03-18 DIAGNOSIS — R262 Difficulty in walking, not elsewhere classified: Secondary | ICD-10-CM | POA: Diagnosis not present

## 2021-03-18 DIAGNOSIS — M25512 Pain in left shoulder: Secondary | ICD-10-CM | POA: Diagnosis not present

## 2021-03-18 DIAGNOSIS — M25552 Pain in left hip: Secondary | ICD-10-CM | POA: Diagnosis not present

## 2021-03-23 DIAGNOSIS — M25552 Pain in left hip: Secondary | ICD-10-CM | POA: Diagnosis not present

## 2021-03-23 DIAGNOSIS — M6281 Muscle weakness (generalized): Secondary | ICD-10-CM | POA: Diagnosis not present

## 2021-03-23 DIAGNOSIS — M25512 Pain in left shoulder: Secondary | ICD-10-CM | POA: Diagnosis not present

## 2021-03-23 DIAGNOSIS — R262 Difficulty in walking, not elsewhere classified: Secondary | ICD-10-CM | POA: Diagnosis not present

## 2021-04-01 DIAGNOSIS — M6281 Muscle weakness (generalized): Secondary | ICD-10-CM | POA: Diagnosis not present

## 2021-04-01 DIAGNOSIS — M25552 Pain in left hip: Secondary | ICD-10-CM | POA: Diagnosis not present

## 2021-04-01 DIAGNOSIS — R262 Difficulty in walking, not elsewhere classified: Secondary | ICD-10-CM | POA: Diagnosis not present

## 2021-04-01 DIAGNOSIS — M25512 Pain in left shoulder: Secondary | ICD-10-CM | POA: Diagnosis not present

## 2021-04-15 DIAGNOSIS — L603 Nail dystrophy: Secondary | ICD-10-CM | POA: Diagnosis not present

## 2021-04-15 DIAGNOSIS — I739 Peripheral vascular disease, unspecified: Secondary | ICD-10-CM | POA: Diagnosis not present

## 2021-04-15 DIAGNOSIS — L84 Corns and callosities: Secondary | ICD-10-CM | POA: Diagnosis not present

## 2021-04-27 DIAGNOSIS — G4733 Obstructive sleep apnea (adult) (pediatric): Secondary | ICD-10-CM | POA: Diagnosis not present

## 2021-06-27 DIAGNOSIS — M21371 Foot drop, right foot: Secondary | ICD-10-CM | POA: Diagnosis not present

## 2021-06-27 DIAGNOSIS — Z79899 Other long term (current) drug therapy: Secondary | ICD-10-CM | POA: Diagnosis not present

## 2021-06-27 DIAGNOSIS — M1612 Unilateral primary osteoarthritis, left hip: Secondary | ICD-10-CM | POA: Diagnosis not present

## 2021-06-27 DIAGNOSIS — M170 Bilateral primary osteoarthritis of knee: Secondary | ICD-10-CM | POA: Diagnosis not present

## 2021-06-27 DIAGNOSIS — Z7689 Persons encountering health services in other specified circumstances: Secondary | ICD-10-CM | POA: Diagnosis not present

## 2021-06-27 DIAGNOSIS — H6123 Impacted cerumen, bilateral: Secondary | ICD-10-CM | POA: Diagnosis not present

## 2021-07-01 DIAGNOSIS — U071 COVID-19: Secondary | ICD-10-CM | POA: Diagnosis not present

## 2021-07-04 DIAGNOSIS — U071 COVID-19: Secondary | ICD-10-CM | POA: Diagnosis not present

## 2021-07-16 DIAGNOSIS — Z20822 Contact with and (suspected) exposure to covid-19: Secondary | ICD-10-CM | POA: Diagnosis not present

## 2021-07-22 DIAGNOSIS — I739 Peripheral vascular disease, unspecified: Secondary | ICD-10-CM | POA: Diagnosis not present

## 2021-07-22 DIAGNOSIS — L84 Corns and callosities: Secondary | ICD-10-CM | POA: Diagnosis not present

## 2021-07-22 DIAGNOSIS — L603 Nail dystrophy: Secondary | ICD-10-CM | POA: Diagnosis not present

## 2021-07-25 DIAGNOSIS — H6123 Impacted cerumen, bilateral: Secondary | ICD-10-CM | POA: Diagnosis not present

## 2021-07-27 DIAGNOSIS — Z23 Encounter for immunization: Secondary | ICD-10-CM | POA: Diagnosis not present

## 2021-08-16 DIAGNOSIS — Z20822 Contact with and (suspected) exposure to covid-19: Secondary | ICD-10-CM | POA: Diagnosis not present

## 2021-08-29 DIAGNOSIS — R972 Elevated prostate specific antigen [PSA]: Secondary | ICD-10-CM | POA: Diagnosis not present

## 2021-09-05 DIAGNOSIS — N401 Enlarged prostate with lower urinary tract symptoms: Secondary | ICD-10-CM | POA: Diagnosis not present

## 2021-09-05 DIAGNOSIS — N3941 Urge incontinence: Secondary | ICD-10-CM | POA: Diagnosis not present

## 2021-09-05 DIAGNOSIS — R350 Frequency of micturition: Secondary | ICD-10-CM | POA: Diagnosis not present

## 2021-09-05 DIAGNOSIS — R972 Elevated prostate specific antigen [PSA]: Secondary | ICD-10-CM | POA: Diagnosis not present

## 2021-09-05 DIAGNOSIS — R3914 Feeling of incomplete bladder emptying: Secondary | ICD-10-CM | POA: Diagnosis not present

## 2021-09-06 ENCOUNTER — Other Ambulatory Visit: Payer: Self-pay | Admitting: Urology

## 2021-09-22 NOTE — Progress Notes (Addendum)
COVID swab appointment: 09/28/21  COVID Vaccine Completed:yes x5 Date COVID Vaccine completed: Has received booster: COVID vaccine manufacturer: Pfizer      Date of COVID positive in last 90 days: no  PCP - Dibas Koirala, MD Cardiologist - n/a  Chest x-ray - n/a EKG - n/a Stress Test - yes 6 years ago per pt ECHO - yes 2007 Cardiac Cath - n/a Pacemaker/ICD device last checked: n/a Spinal Cord Stimulator: n/a  Sleep Study - yes, positive CPAP - yes every night  Fasting Blood Sugar - pre DM, pt denies no BS checks at home Checks Blood Sugar _____ times a day  Blood Thinner Instructions: n/a Aspirin Instructions: Last Dose:  Activity level: Can perform activities of daily living without stopping and without symptoms of chest pain or shortness of breath. No stairs due to leg pain/weakness    Anesthesia review:   Patient denies shortness of breath, fever, cough and chest pain at PAT appointment   Patient verbalized understanding of instructions that were given to them at the PAT appointment. Patient was also instructed that they will need to review over the PAT instructions again at home before surgery.

## 2021-09-22 NOTE — Patient Instructions (Addendum)
DUE TO COVID-19 ONLY ONE VISITOR IS ALLOWED TO COME WITH YOU AND STAY IN THE WAITING ROOM ONLY DURING PRE OP AND PROCEDURE.   **NO VISITORS ARE ALLOWED IN THE SHORT STAY AREA OR RECOVERY ROOM!!**  IF YOU WILL BE ADMITTED INTO THE HOSPITAL YOU ARE ALLOWED ONLY TWO SUPPORT PEOPLE DURING VISITATION HOURS ONLY (7AM -8PM)   The support person(s) may change daily. The support person(s) must pass our screening, gel in and out, and wear a mask at all times, including in the patient's room. Patients must also wear a mask when staff or their support person are in the room.  No visitors under the age of 8. Any visitor under the age of 74 must be accompanied by an adult.    COVID SWAB TESTING MUST BE COMPLETED ON:  09/28/21 **MUST PRESENT COMPLETED FORM AT TESTING SITE**    [facility] (backside of the building) Open 8am-3pm. No appointment needed. You are not required to quarantine, however you are required to wear a well-fitted mask when you are out and around people not in your household.  Hand Hygiene often Do NOT share personal items Notify your provider if you are in close contact with someone who has COVID or you develop fever 100.4 or greater, new onset of sneezing, cough, sore throat, shortness of breath or body aches.   Your procedure is scheduled on: 09/30/21   Report to [facility] Main Entrance    Report to admitting at 8:15 AM   Call this number if you have problems the morning of surgery [phone]   Do not eat food :After Midnight.   May have liquids until 7:30 AM day of surgery  CLEAR LIQUID DIET  Foods Allowed                                                                     Foods Excluded  Water, Black Coffee and tea (no milk or creamer)           liquids that you cannot  Plain Jell-O in any flavor  (No red)                                    see through such as: Fruit ices (not with fruit pulp)                                             milk, soups, orange juice              Iced Popsicles (No red)                                                All solid food                                   Apple juices Sports  drinks like Gatorade (No red) Lightly seasoned clear broth or consume(fat free) Sugar    Oral Hygiene is also important to reduce your risk of infection.                                    Remember - BRUSH YOUR TEETH THE MORNING OF SURGERY WITH YOUR REGULAR TOOTHPASTE   Take these medicines the morning of surgery with A SIP OF WATER: Lipitor             You may not have any metal on your body including jewelry, and body piercing             Do not wear lotions, powders, cologne, or deodorant              Men may shave face and neck.   Do not bring valuables to the hospital. [place].   Bring small overnight bag day of surgery.   Special Instructions: Bring a copy of your healthcare power of attorney and living will documents         the day of surgery if you haven't scanned them before.              Please read over the following fact sheets you were given: IF YOU HAVE QUESTIONS ABOUT YOUR PRE-OP INSTRUCTIONS PLEASE CALL [facility] - Preparing for Surgery Before surgery, you can play an important role.  Because skin is not sterile, your skin needs to be as free of germs as possible.  You can reduce the number of germs on your skin by washing with CHG (chlorahexidine gluconate) soap before surgery.  CHG is an antiseptic cleaner which kills germs and bonds with the skin to continue killing germs even after washing. Please DO NOT use if you have an allergy to CHG or antibacterial soaps.  If your skin becomes reddened/irritated stop using the CHG and inform your nurse when you arrive at Short Stay. Do not shave (including legs and underarms) for at least 48 hours prior to the first CHG shower.  You may shave your face/neck.  Please follow  these instructions carefully:  1.  Shower with CHG Soap the night before surgery and the  morning of surgery.  2.  If you choose to wash your hair, wash your hair first as usual with your normal  shampoo.  3.  After you shampoo, rinse your hair and body thoroughly to remove the shampoo.                             4.  Use CHG as you would any other liquid soap.  You can apply chg directly to the skin and wash.  Gently with a scrungie or clean washcloth.  5.  Apply the CHG Soap to your body ONLY FROM THE NECK DOWN.   Do   not use on face/ open                           Wound or open sores. Avoid contact with eyes, ears mouth and   genitals (private parts).  Wash face,  Genitals (private parts) with your normal soap.             6.  Wash thoroughly, paying special attention to the area where your    surgery  will be performed.  7.  Thoroughly rinse your body with warm water from the neck down.  8.  DO NOT shower/wash with your normal soap after using and rinsing off the CHG Soap.                9.  Pat yourself dry with a clean towel.            10.  Wear clean pajamas.            11.  Place clean sheets on your bed the night of your first shower and do not  sleep with pets. Day of Surgery : Do not apply any lotions/deodorants the morning of surgery.  Please wear clean clothes to the hospital/surgery center.  FAILURE TO FOLLOW THESE INSTRUCTIONS MAY RESULT IN THE CANCELLATION OF YOUR SURGERY  PATIENT SIGNATURE_________________________________  NURSE SIGNATURE__________________________________  ________________________________________________________________________

## 2021-09-23 ENCOUNTER — Encounter (HOSPITAL_COMMUNITY)
Admission: RE | Admit: 2021-09-23 | Discharge: 2021-09-23 | Disposition: A | Discharge status: Home / Self Care | Payer: Medicare Other | Source: Ambulatory Visit | Attending: Urology | Admitting: Urology

## 2021-09-23 ENCOUNTER — Other Ambulatory Visit: Payer: Self-pay

## 2021-09-23 ENCOUNTER — Encounter (HOSPITAL_COMMUNITY): Payer: Self-pay

## 2021-09-23 VITALS — BP 160/88 | HR 65 | Temp 98.6°F | Resp 20 | Ht 73.0 in | Wt 270.0 lb

## 2021-09-23 DIAGNOSIS — R7303 Prediabetes: Secondary | ICD-10-CM | POA: Diagnosis not present

## 2021-09-23 DIAGNOSIS — Z01812 Encounter for preprocedural laboratory examination: Secondary | ICD-10-CM | POA: Insufficient documentation

## 2021-09-23 HISTORY — DX: Cardiac murmur, unspecified: R01.1

## 2021-09-23 LAB — CBC
HCT: 43.6 % (ref 39.0–52.0)
Hemoglobin: 13.7 g/dL (ref 13.0–17.0)
MCH: 29 pg (ref 26.0–34.0)
MCHC: 31.4 g/dL (ref 30.0–36.0)
MCV: 92.2 fL (ref 80.0–100.0)
Platelets: 156 10*3/uL (ref 150–400)
RBC: 4.73 MIL/uL (ref 4.22–5.81)
RDW: 14 % (ref 11.5–15.5)
WBC: 10.9 10*3/uL — ABNORMAL HIGH (ref 4.0–10.5)
nRBC: 0 % (ref 0.0–0.2)

## 2021-09-23 LAB — BASIC METABOLIC PANEL
Anion gap: 6 (ref 5–15)
BUN: 27 mg/dL — ABNORMAL HIGH (ref 8–23)
CO2: 26 mmol/L (ref 22–32)
Calcium: 9.2 mg/dL (ref 8.9–10.3)
Chloride: 106 mmol/L (ref 98–111)
Creatinine, Ser: 0.91 mg/dL (ref 0.61–1.24)
GFR, Estimated: >60 mL/min (ref >60)
Glucose, Bld: 102 mg/dL — ABNORMAL HIGH (ref 70–99)
Potassium: 4.3 mmol/L (ref 3.5–5.1)
Sodium: 138 mmol/L (ref 135–145)

## 2021-09-23 LAB — GLUCOSE, CAPILLARY: Glucose-Capillary: 112 mg/dL — ABNORMAL HIGH (ref 70–99)

## 2021-09-24 LAB — HEMOGLOBIN A1C
Hgb A1c MFr Bld: 5.4 % (ref 4.8–5.6)
Mean Plasma Glucose: 108.28 mg/dL

## 2021-09-28 ENCOUNTER — Other Ambulatory Visit: Payer: Self-pay | Admitting: Urology

## 2021-09-29 LAB — SARS CORONAVIRUS 2 (TAT 6-24 HRS): SARS Coronavirus 2: NEGATIVE

## 2021-09-29 NOTE — H&P (Signed)
I have symptoms of an enlarged prostate.  HPI: Kenneth Everett is a 75 year-old male established patient who is here for symptoms of enlarged prostate.    Yao returns today in f/u. He has a history of BPH with BOO with urodynamics last year that found to have an small capacity unstable bladder with BOO and a voiding pressure of 34ml. His PVR was 339ml with the study. There was trabeculation and small diverticuli. He had an elevated PSA but it remains suppressed at 0.42 prior to this visit on finasteride He is also on alfuzosin (replaced silodosin), vesicare and Myrbetriq. He is no longer on furosemide in the morning. he has some urgency if he drinks too much fluid. His IPSS is stable at 12. He has variable symptoms and can have some urgencyt with UUI about 1 x weekly that is worse after he is coming home from an outing. He has no associated signs or symptoms. His PVR is 11ml.      AUA Symptom Score: 50% of the time he has the sensation of not emptying his bladder completely when finished urinating. Less than 50% of the time he has to urinate again fewer than two hours after he has finished urinating. Less than 20% of the time he has to start and stop again several times when he urinates. More than 50% of the time he finds it difficult to postpone urination. He never has a weak urinary stream. He never has to push or strain to begin urination. He has to get up to urinate 2 times from the time he goes to bed until the time he gets up in the morning.   Calculated AUA Symptom Score: 12    ALLERGIES: None   MEDICATIONS: Finasteride 5 mg tablet 1 tablet PO Daily  Myrbetriq 50 mg tablet, extended release 24 hr 1 tablet PO Daily  Myrbetriq  Vesicare 5 mg tablet 1 tablet PO Daily  Alfuzosin Hcl Er 10 mg tablet, extended release 24 hr 1 tablet PO Daily  Atorvastatin Calcium 10 mg tablet  Coq10  Fish Oil 1 PO Daily  Furosemide 40 mg tablet  Glucosamine  Lutein  Meloxicam 7.5 mg tablet  Multivitamin   Omega 3  Potassium Chloride 10 meq capsule, extended release  Solifenacin Succinate  Turmeric  Vitamin B12 1 PO Daily  Vitamin C  Vitamin D     GU PSH: Complex cystometrogram, w/ void pressure and urethral pressure profile studies, any technique - 2018 Complex Uroflow - 2018 Cystoscopy - 2018 Emg surf Electrd - 2018 Inject For cystogram - 2018 Intrabd voidng Press - 2018     NON-GU PSH: Extensive Foot Surgery Extensive Hip Surgery     GU PMH: BPH w/LUTS, He is voiding with some increased symptoms but he attributes that to his fluid intake. I have refilled his meds and will have him return in a year with a PVR and PSA. - 09/06/2020, (Improving), He is doing well on current therapy. His meds were refilled and he will return in 1 year for a PVR. , - 09/10/2019 (Improving), He is doing well on current therapy with mild/mod LUTS. I have refilled his meds. He will return a year. , - 2019, He is doing better on the combination of meds he is currently taking. HE will continue those meds and return in 6 months with a PVR and PSA. 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 , - 2019, He has BPH with a mildly elevated PVR and significant frequency and urgency with UUI on Vesicare 5mg . , - 2018 Elevated PSA, His PSA remains suppressed on finasteride but is up slightly. I will continue to monitor that. - 09/06/2020, His PSA continues to fall on finasteride. , - 2018 (Improving), His PSA is down from 4.2 to 3.42. , - 2018, He has a mild PSA elevation with a benign exam. , - 2018 Urge incontinence - 09/06/2020, - 2018 Urinary Frequency, with fluid intake. I told him he didn't need to over hydrate. - 09/06/2020, - 2018    NON-GU PMH: Arthritis Cardiac murmur, unspecified Hypercholesterolemia Sleep Apnea    FAMILY HISTORY: Heart problem - Mother, Father   SOCIAL HISTORY: Marital Status:  Married Preferred Language: English; Ethnicity: Not Hispanic Or Latino; Race: White Current Smoking Status: Patient has never smoked.   Tobacco Use Assessment Completed: Used Tobacco in last 30 days? Drinks 1 caffeinated drink per day.     Notes: 1 son    REVIEW OF SYSTEMS:    GU Review Male:   Patient reports get up at night to urinate and leakage of urine. Patient denies frequent urination, hard to postpone urination, burning/ pain with urination, stream starts and stops, trouble starting your stream, have to strain to urinate , erection problems, and penile pain.  Gastrointestinal (Upper):   Patient denies nausea, vomiting, and indigestion/ heartburn.  Gastrointestinal (Lower):   Patient denies diarrhea and constipation.  Constitutional:   Patient denies fever, night sweats, weight loss, and fatigue.  Skin:   Patient denies skin rash/ lesion and itching.  Eyes:   Patient denies blurred vision and double vision.  Ears/ Nose/ Throat:   Patient denies sore throat and sinus problems.  Hematologic/Lymphatic:   Patient denies swollen glands and easy bruising.  Cardiovascular:   Patient denies leg swelling and chest pains.  Respiratory:   Patient denies cough and shortness of breath.  Endocrine:   Patient denies excessive thirst.  Musculoskeletal:   Patient denies back pain and joint pain.  Neurological:   Patient denies headaches and dizziness.  Psychologic:   Patient denies depression and anxiety.   VITAL SIGNS: None   MULTI-SYSTEM PHYSICAL EXAMINATION:    Constitutional: Obese. No physical deformities. Normally developed. Good grooming.   Respiratory: Normal breath sounds. No labored breathing, no use of accessory muscles.   Cardiovascular: Regular rate and rhythm. No murmur, no gallop.   Musculoskeletal: He uses a walker and has limited right hip mobility.      Complexity of Data:  Lab Test Review:   PSA  Records Review:   AUA Symptom Score, Previous Patient Records  Urine Test  Review:   Urinalysis  Urodynamics Review:   Review Bladder Scan  X-Ray Review: C.T. Abdomen/Pelvis: Reviewed Films. Discussed With Patient. He had a CT in 4/22 that showed moderate prostate enlargement.     08/29/21 08/30/20 09/10/19 08/28/18 09/11/17 03/21/17 11/29/16 09/15/16  PSA  Total PSA 0.42 ng/mL 0.66 ng/mL 0.61 ng/mL 0.54 ng/mL 0.69 ng/mL 1.14 ng/dl 3.45 ng/dl 4.2 ng/dl    PROCEDURES:         PVR Ultrasound - 51798  Scanned Volume: 167 cc         Urinalysis Dipstick Dipstick Cont'd  Color: Yellow Bilirubin: Neg mg/dL  Appearance: Clear Ketones: Neg mg/dL  Specific Gravity: 1.025 Blood: Neg ery/uL  pH: <=5.0 Protein: Neg mg/dL  Glucose: Neg mg/dL Urobilinogen: 0.2 mg/dL    Nitrites:  Neg    Leukocyte Esterase: Neg leu/uL    ASSESSMENT:      ICD-10 Details  1 GU:   BPH w/LUTS - N40.1 Chronic, Worsening - He has some worsening LUTS with UUI despite maximum medical therapy. I discussed options and will get him scheduled for a TURP. I reviewd the risks of a TURP including bleeding, infection, incontinence, stricture, need for secondary procedures, ejaculatory and erectile dysfunction, thrombotic events, fluid overload and anesthetic complications. I explained that 95% of men will have relief of the obstructive symptoms and about 70% will have relief of the irritative symptoms.   2   Urge incontinence - N39.41 Chronic, Worsening  3   Urinary Frequency - R35.0 Chronic, Worsening  4   Incomplete bladder emptying - R39.14 Chronic, Stable - PVR is 137ml  5   Elevated PSA - R97.20 Minor - His PSA remains suppressed on finasteride.    PLAN:            Medications Refill Meds: Finasteride 5 mg tablet 1 tablet PO Daily   #90  3 Refill(s)  Myrbetriq 50 mg tablet, extended release 24 hr 1 tablet PO Daily   #90  3 Refill(s)  Vesicare 5 mg tablet 1 tablet PO Daily   #90  3 Refill(s)  Alfuzosin Hcl Er 10 mg tablet, extended release 24 hr 1 tablet PO Daily   #90  3 Refill(s)             Schedule Return Visit/Planned Activity: Next Available Appointment - Schedule Surgery             Note: TURP          Document Letter(s):  Created for Patient: Clinical Summary         Notes:   CC: Dr. Lujean Amel.         Next Appointment:      Next Appointment: 09/30/2021 10:30 AM    Appointment Type: Surgery     Location: [facility] [phone]    Provider: Irine Seal, M.D.    Reason for Visit: OBS WL TURP

## 2021-09-30 ENCOUNTER — Ambulatory Visit (HOSPITAL_COMMUNITY): Payer: Medicare Other | Admitting: Anesthesiology

## 2021-09-30 ENCOUNTER — Encounter (HOSPITAL_COMMUNITY)
Admission: RE | Disposition: A | Discharge status: Home / Self Care | Payer: Self-pay | Source: Ambulatory Visit | Attending: Urology

## 2021-09-30 ENCOUNTER — Inpatient Hospital Stay (HOSPITAL_COMMUNITY)
Admission: RE | Admit: 2021-09-30 | Discharge: 2021-10-02 | DRG: 713 | Disposition: A | Discharge status: Home / Self Care | Payer: Medicare Other | Source: Ambulatory Visit | Attending: Urology | Admitting: Urology

## 2021-09-30 ENCOUNTER — Encounter (HOSPITAL_COMMUNITY): Payer: Self-pay | Admitting: Urology

## 2021-09-30 ENCOUNTER — Other Ambulatory Visit: Payer: Self-pay

## 2021-09-30 DIAGNOSIS — Z791 Long term (current) use of non-steroidal anti-inflammatories (NSAID): Secondary | ICD-10-CM

## 2021-09-30 DIAGNOSIS — N138 Other obstructive and reflux uropathy: Secondary | ICD-10-CM | POA: Diagnosis present

## 2021-09-30 DIAGNOSIS — N401 Enlarged prostate with lower urinary tract symptoms: Secondary | ICD-10-CM | POA: Diagnosis not present

## 2021-09-30 DIAGNOSIS — N320 Bladder-neck obstruction: Secondary | ICD-10-CM | POA: Diagnosis not present

## 2021-09-30 DIAGNOSIS — Z79899 Other long term (current) drug therapy: Secondary | ICD-10-CM

## 2021-09-30 DIAGNOSIS — N400 Enlarged prostate without lower urinary tract symptoms: Secondary | ICD-10-CM | POA: Diagnosis not present

## 2021-09-30 HISTORY — PX: TRANSURETHRAL RESECTION OF PROSTATE: SHX73

## 2021-09-30 LAB — GLUCOSE, CAPILLARY: Glucose-Capillary: 108 mg/dL — ABNORMAL HIGH (ref 70–99)

## 2021-09-30 SURGERY — TURP (TRANSURETHRAL RESECTION OF PROSTATE)
Anesthesia: General

## 2021-09-30 MED ORDER — ZOLPIDEM TARTRATE 5 MG PO TABS: 5.0000 mg | ORAL_TABLET | Freq: Every evening | ORAL | Status: DC | PRN

## 2021-09-30 MED ORDER — ACETAMINOPHEN 10 MG/ML IV SOLN
1000.0000 mg | Freq: Once | INTRAVENOUS | Status: DC | PRN
  Administered 2021-09-30: 1000 mg via INTRAVENOUS

## 2021-09-30 MED ORDER — OXYCODONE HCL 5 MG PO TABS
5.0000 mg | ORAL_TABLET | ORAL | Status: DC | PRN
  Filled 2021-09-30: qty 1

## 2021-09-30 MED ORDER — PROPOFOL 10 MG/ML IV BOLUS
INTRAVENOUS | Status: DC | PRN
  Administered 2021-09-30: 200 mg via INTRAVENOUS

## 2021-09-30 MED ORDER — ONDANSETRON HCL 4 MG/2ML IJ SOLN
INTRAMUSCULAR | Status: DC | PRN
  Administered 2021-09-30: 4 mg via INTRAVENOUS

## 2021-09-30 MED ORDER — OXYCODONE HCL 5 MG/5ML PO SOLN: 5.0000 mg | Freq: Once | ORAL | Status: AC | PRN

## 2021-09-30 MED ORDER — FENTANYL CITRATE (PF) 100 MCG/2ML IJ SOLN
INTRAMUSCULAR | Status: DC | PRN
  Administered 2021-09-30: 100 ug via INTRAVENOUS
  Administered 2021-09-30 (×4): 25 ug via INTRAVENOUS

## 2021-09-30 MED ORDER — OXYBUTYNIN CHLORIDE 5 MG PO TABS: 5.0000 mg | ORAL_TABLET | Freq: Three times a day (TID) | ORAL | Status: DC | PRN

## 2021-09-30 MED ORDER — FENTANYL CITRATE (PF) 100 MCG/2ML IJ SOLN
INTRAMUSCULAR | Status: AC
  Filled 2021-09-30: qty 2

## 2021-09-30 MED ORDER — DEXMEDETOMIDINE (PRECEDEX) IN NS 20 MCG/5ML (4 MCG/ML) IV SYRINGE
PREFILLED_SYRINGE | INTRAVENOUS | Status: DC | PRN
  Administered 2021-09-30: 8 ug via INTRAVENOUS
  Administered 2021-09-30: 12 ug via INTRAVENOUS

## 2021-09-30 MED ORDER — HYDRALAZINE HCL 20 MG/ML IJ SOLN
INTRAMUSCULAR | Status: AC
  Filled 2021-09-30: qty 1

## 2021-09-30 MED ORDER — ACETAMINOPHEN 325 MG PO TABS: 650.0000 mg | ORAL_TABLET | ORAL | Status: DC | PRN

## 2021-09-30 MED ORDER — ORAL CARE MOUTH RINSE
15.0000 mL | Freq: Once | OROMUCOSAL | Status: AC
  Administered 2021-09-30: 15 mL via OROMUCOSAL

## 2021-09-30 MED ORDER — FLEET ENEMA 7-19 GM/118ML RE ENEM: 1.0000 | ENEMA | Freq: Once | RECTAL | Status: DC | PRN

## 2021-09-30 MED ORDER — HYDROMORPHONE HCL 1 MG/ML IJ SOLN: 0.5000 mg | INTRAMUSCULAR | Status: DC | PRN

## 2021-09-30 MED ORDER — FENTANYL CITRATE PF 50 MCG/ML IJ SOSY
25.0000 ug | PREFILLED_SYRINGE | INTRAMUSCULAR | Status: DC | PRN
  Administered 2021-09-30: 25 ug via INTRAVENOUS

## 2021-09-30 MED ORDER — BISACODYL 10 MG RE SUPP: 10.0000 mg | Freq: Every day | RECTAL | Status: DC | PRN

## 2021-09-30 MED ORDER — PROPOFOL 10 MG/ML IV BOLUS
INTRAVENOUS | Status: AC
  Filled 2021-09-30: qty 20

## 2021-09-30 MED ORDER — CEFAZOLIN SODIUM-DEXTROSE 1-4 GM/50ML-% IV SOLN
1.0000 g | Freq: Three times a day (TID) | INTRAVENOUS | Status: AC
  Administered 2021-09-30 – 2021-10-01 (×2): 1 g via INTRAVENOUS
  Filled 2021-09-30 (×2): qty 50

## 2021-09-30 MED ORDER — FINASTERIDE 5 MG PO TABS
5.0000 mg | ORAL_TABLET | Freq: Every day | ORAL | Status: DC
  Administered 2021-09-30 – 2021-10-02 (×3): 5 mg via ORAL
  Filled 2021-09-30 (×3): qty 1

## 2021-09-30 MED ORDER — SODIUM CHLORIDE 0.9 % IR SOLN
Status: DC | PRN
  Administered 2021-09-30 (×2): 6000 mL via INTRAVESICAL

## 2021-09-30 MED ORDER — ACETAMINOPHEN 160 MG/5ML PO SOLN: 325.0000 mg | ORAL | Status: DC | PRN

## 2021-09-30 MED ORDER — LIDOCAINE 2% (20 MG/ML) 5 ML SYRINGE
INTRAMUSCULAR | Status: DC | PRN
Start: 2021-09-30 — End: 2021-09-30
  Administered 2021-09-30: 100 mg via INTRAVENOUS

## 2021-09-30 MED ORDER — ATORVASTATIN CALCIUM 20 MG PO TABS
20.0000 mg | ORAL_TABLET | Freq: Every day | ORAL | Status: DC
  Administered 2021-09-30 – 2021-10-02 (×3): 20 mg via ORAL
  Filled 2021-09-30 (×3): qty 1

## 2021-09-30 MED ORDER — DOCUSATE SODIUM 100 MG PO CAPS
100.0000 mg | ORAL_CAPSULE | Freq: Two times a day (BID) | ORAL | Status: DC
  Administered 2021-09-30 – 2021-10-02 (×4): 100 mg via ORAL
  Filled 2021-09-30 (×4): qty 1

## 2021-09-30 MED ORDER — OXYCODONE HCL 5 MG PO TABS
5.0000 mg | ORAL_TABLET | Freq: Once | ORAL | Status: AC | PRN
  Administered 2021-09-30: 5 mg via ORAL

## 2021-09-30 MED ORDER — ONDANSETRON HCL 4 MG/2ML IJ SOLN: 4.0000 mg | INTRAMUSCULAR | Status: DC | PRN

## 2021-09-30 MED ORDER — POTASSIUM CHLORIDE IN NACL 20-0.45 MEQ/L-% IV SOLN
INTRAVENOUS | Status: DC
  Filled 2021-09-30 (×6): qty 1000

## 2021-09-30 MED ORDER — CHLORHEXIDINE GLUCONATE 0.12 % MT SOLN: 15.0000 mL | Freq: Once | OROMUCOSAL | Status: AC

## 2021-09-30 MED ORDER — ACETAMINOPHEN 10 MG/ML IV SOLN
INTRAVENOUS | Status: AC
  Filled 2021-09-30: qty 100

## 2021-09-30 MED ORDER — AMISULPRIDE (ANTIEMETIC) 5 MG/2ML IV SOLN: 10.0000 mg | Freq: Once | INTRAVENOUS | Status: DC | PRN

## 2021-09-30 MED ORDER — DIPHENHYDRAMINE HCL 12.5 MG/5ML PO ELIX: 12.5000 mg | ORAL_SOLUTION | Freq: Four times a day (QID) | ORAL | Status: DC | PRN

## 2021-09-30 MED ORDER — FENTANYL CITRATE PF 50 MCG/ML IJ SOSY
PREFILLED_SYRINGE | INTRAMUSCULAR | Status: AC
  Administered 2021-09-30: 50 ug via INTRAVENOUS
  Filled 2021-09-30: qty 2

## 2021-09-30 MED ORDER — LACTATED RINGERS IV SOLN: INTRAVENOUS | Status: DC

## 2021-09-30 MED ORDER — OXYCODONE HCL 5 MG PO TABS
ORAL_TABLET | ORAL | Status: AC
  Filled 2021-09-30: qty 1

## 2021-09-30 MED ORDER — ACETAMINOPHEN 325 MG PO TABS: 325.0000 mg | ORAL_TABLET | ORAL | Status: DC | PRN

## 2021-09-30 MED ORDER — DEXAMETHASONE SODIUM PHOSPHATE 10 MG/ML IJ SOLN
INTRAMUSCULAR | Status: DC | PRN
  Administered 2021-09-30: 5 mg via INTRAVENOUS

## 2021-09-30 MED ORDER — HYDRALAZINE HCL 20 MG/ML IJ SOLN
INTRAMUSCULAR | Status: DC | PRN
  Administered 2021-09-30 (×3): 2.5 mg via INTRAVENOUS

## 2021-09-30 MED ORDER — CEFAZOLIN IN SODIUM CHLORIDE 3-0.9 GM/100ML-% IV SOLN
3.0000 g | INTRAVENOUS | Status: AC
  Administered 2021-09-30: 3 g via INTRAVENOUS
  Filled 2021-09-30: qty 100

## 2021-09-30 MED ORDER — DIPHENHYDRAMINE HCL 50 MG/ML IJ SOLN: 12.5000 mg | Freq: Four times a day (QID) | INTRAMUSCULAR | Status: DC | PRN

## 2021-09-30 MED ORDER — SENNOSIDES-DOCUSATE SODIUM 8.6-50 MG PO TABS
1.0000 | ORAL_TABLET | Freq: Every evening | ORAL | Status: DC | PRN
  Administered 2021-10-01: 1 via ORAL
  Filled 2021-09-30: qty 1

## 2021-09-30 MED ORDER — PROMETHAZINE HCL 25 MG/ML IJ SOLN: 6.2500 mg | INTRAMUSCULAR | Status: DC | PRN

## 2021-09-30 SURGICAL SUPPLY — 18 items
BAG URINE DRAIN 2000ML AR STRL (UROLOGICAL SUPPLIES) IMPLANT
BAG URO CATCHER STRL LF (MISCELLANEOUS) ×3 IMPLANT
CATH FOLEY 3WAY 30CC 22FR (CATHETERS) ×2 IMPLANT
DRAPE FOOT SWITCH (DRAPES) ×3 IMPLANT
ELECT REM PT RETURN 15FT ADLT (MISCELLANEOUS) ×3 IMPLANT
GLOVE SURG POLYISO LF SZ8 (GLOVE) IMPLANT
GOWN STRL REUS W/TWL XL LVL3 (GOWN DISPOSABLE) ×3 IMPLANT
HOLDER FOLEY CATH W/STRAP (MISCELLANEOUS) IMPLANT
KIT TURNOVER KIT A (KITS) IMPLANT
LOOP CUT BIPOLAR 24F LRG (ELECTROSURGICAL) IMPLANT
MANIFOLD NEPTUNE II (INSTRUMENTS) ×3 IMPLANT
PACK CYSTO (CUSTOM PROCEDURE TRAY) ×3 IMPLANT
SYR 30ML LL (SYRINGE) IMPLANT
SYR TOOMEY IRRIG 70ML (MISCELLANEOUS)
SYRINGE TOOMEY IRRIG 70ML (MISCELLANEOUS) IMPLANT
TUBING CONNECTING 10 (TUBING) ×2 IMPLANT
TUBING CONNECTING 10' (TUBING) ×1
TUBING UROLOGY SET (TUBING) ×3 IMPLANT

## 2021-09-30 NOTE — Op Note (Signed)
Preoperative diagnosis: Bladder outlet obstruction secondary to BPH  Postoperative diagnosis:  Bladder outlet obstruction secondary to BPH  Procedure:  Cystoscopy Transurethral resection of the prostate  Surgeon: Irine Seal. M.D.  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 After reviewing the management options for treatment, he elected to proceed with the above surgical procedure(s). We have discussed the potential benefits and risks of the procedure, side effects of the proposed treatment, the likelihood of the patient achieving the goals of the procedure, and any potential problems that might occur during the procedure or recuperation. Informed consent has been obtained.  Description of procedure:  The patient was taken to the operating room and general anesthesia was induced.  The patient was placed in the dorsal lithotomy position, prepped and draped in the usual sterile fashion, and preoperative antibiotics were administered. A preoperative time-out was performed.   Cystourethroscopy was performed.  The patients urethra was examined and was 4cm with bilobar hyperplasia without a middle lobe.  The bladder was then systematically examined in its entirety. There was mild trabeculation with no evidence of any bladder tumors, stones, or other mucosal pathology.  The ureteral orifices were identified and marked so as to be avoided during the procedure.  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 Care was taken not to resect  distal to the verumontanum. The was some undermining of the bladder neck..  At the completion of the procedure the bladder was evacuated free of chips and hemostasis was insured.  Final inspection revealed intact ureteral orifices, a widely patent TUR channel and an intact external sphincter.   Hemostasis was then achieved with the cautery and the bladder was emptied and reinspected with no significant bleeding noted at the end of the procedure.    A 57fr 3 way catheter was then placed with the aid of a catheter guide to get over the bladder neck and into the bladder.  The catheter was hand irrigated with clear return and it was then placed on continuous bladder irrigation.  The patient appeared to tolerate the procedure well and without complications.  The patient was able to be awakened and transferred to the recovery unit in satisfactory condition.

## 2021-09-30 NOTE — Interval H&P Note (Signed)
History and Physical Interval Note:  09/30/2021 10:31 AM  Kenneth Everett  has presented today for surgery, with the diagnosis of [place].  The various methods of treatment have been discussed with the patient and family. After consideration of risks, benefits and other options for treatment, the patient has consented to  Procedure(s): TRANSURETHRAL RESECTION OF THE PROSTATE (TURP) (N/A) as a surgical intervention.  The patient's history has been reviewed, patient examined, no change in status, stable for surgery.  I have reviewed the patient's chart and labs.  Questions were answered to the patient's satisfaction.     Irine Seal

## 2021-09-30 NOTE — Anesthesia Procedure Notes (Signed)
Procedure Name: LMA Insertion Date/Time: 09/30/2021 10:59 AM Performed by: Gean Maidens, CRNA Pre-anesthesia Checklist: Patient identified, Emergency Drugs available, Suction available, Patient being monitored and Timeout performed Patient Re-evaluated:Patient Re-evaluated prior to induction Oxygen Delivery Method: Circle system utilized Preoxygenation: Pre-oxygenation with 100% oxygen Induction Type: IV induction Ventilation: Mask ventilation without difficulty LMA: LMA inserted LMA Size: 5.0 Number of attempts: 1 Placement Confirmation: positive ETCO2 and breath sounds checked- equal and bilateral Tube secured with: Tape Dental Injury: Teeth and Oropharynx as per pre-operative assessment

## 2021-09-30 NOTE — Transfer of Care (Signed)
Immediate Anesthesia Transfer of Care Note  Patient: Kenneth Everett  Procedure(s) Performed: TRANSURETHRAL RESECTION OF THE PROSTATE (TURP)  Patient Location: PACU  Anesthesia Type:General  Level of Consciousness: sedated, patient cooperative and responds to stimulation  Airway & Oxygen Therapy: Patient Spontanous Breathing and Patient connected to face mask oxygen  Post-op Assessment: Report given to RN and Post -op Vital signs reviewed and stable  Post vital signs: Reviewed and stable  Last Vitals:  Vitals Value Taken Time  BP 173/86 09/30/21 1200  Temp    Pulse 70 09/30/21 1200  Resp 13 09/30/21 1200  SpO2 100 % 09/30/21 1200  Vitals shown include unvalidated device data.  Last Pain:  Vitals:   09/30/21 0904  TempSrc:   PainSc: 0-No pain         Complications: No notable events documented.

## 2021-09-30 NOTE — Anesthesia Preprocedure Evaluation (Addendum)
Anesthesia Evaluation  Patient identified by MRN, date of birth, ID band Patient awake    Reviewed: Allergy & Precautions, NPO status , Patient's Chart, lab work & pertinent test results  Airway Mallampati: III  TM Distance: >3 FB Neck ROM: Full    Dental  (+) Chipped,    Pulmonary sleep apnea ,    breath sounds clear to auscultation       Cardiovascular negative cardio ROS   Rhythm:Regular Rate:Normal     Neuro/Psych negative neurological ROS  negative psych ROS   GI/Hepatic negative GI ROS, Neg liver ROS,   Endo/Other  negative endocrine ROS  Renal/GU negative Renal ROS     Musculoskeletal  (+) Arthritis ,   Abdominal Normal abdominal exam  (+)   Peds  Hematology negative hematology ROS (+)   Anesthesia Other Findings   Reproductive/Obstetrics                            Anesthesia Physical Anesthesia Plan  ASA: 3  Anesthesia Plan: General   Post-op Pain Management:    Induction: Intravenous  PONV Risk Score and Plan: 3 and Ondansetron, Dexamethasone and Midazolam  Airway Management Planned: LMA  Additional Equipment: None  Intra-op Plan:   Post-operative Plan: Extubation in OR  Informed Consent: I have reviewed the patients History and Physical, chart, labs and discussed the procedure including the risks, benefits and alternatives for the proposed anesthesia with the patient or authorized representative who has indicated his/her understanding and acceptance.     Dental advisory given  Plan Discussed with: CRNA  Anesthesia Plan Comments:        Anesthesia Quick Evaluation

## 2021-10-01 DIAGNOSIS — N401 Enlarged prostate with lower urinary tract symptoms: Secondary | ICD-10-CM | POA: Diagnosis present

## 2021-10-01 DIAGNOSIS — N138 Other obstructive and reflux uropathy: Secondary | ICD-10-CM | POA: Diagnosis present

## 2021-10-01 DIAGNOSIS — Z79899 Other long term (current) drug therapy: Secondary | ICD-10-CM | POA: Diagnosis not present

## 2021-10-01 DIAGNOSIS — Z791 Long term (current) use of non-steroidal anti-inflammatories (NSAID): Secondary | ICD-10-CM | POA: Diagnosis not present

## 2021-10-01 MED ORDER — CHLORHEXIDINE GLUCONATE CLOTH 2 % EX PADS
6.0000 | MEDICATED_PAD | Freq: Every day | CUTANEOUS | Status: DC
  Administered 2021-10-01: 6 via TOPICAL

## 2021-10-01 NOTE — Progress Notes (Signed)
1 Day Post-Op Subjective: Pain controlled. No nausea or emesis. No flatus or BM. Tolerating diet. Tolerating foley, clear yellow.  Objective: Vital signs in last 24 hours: Temp:  [97.5 F (36.4 C)-98.3 F (36.8 C)] 98.3 F (36.8 C) (12/17 0003) Pulse Rate:  [57-76] 57 (12/17 0003) Resp:  [12-19] 18 (12/17 0003) BP: (105-173)/(56-86) 123/56 (12/17 0003) SpO2:  [95 %-100 %] 95 % (12/17 0003)  Intake/Output from previous day: 12/16 0701 - 12/17 0700 In: 8075 [P.O.:325; I.V.:1450; IV Piggyback:400] Out: 5300 [Urine:5300] Intake/Output this shift: No intake/output data recorded.  Physical Exam:  General: Alert and oriented CV: RRR Lungs: Clear Abdomen: Soft, ND, NT Ext: NT, No erythema  Lab Results: No results for input(s): HGB, HCT in the last 72 hours. BMET No results for input(s): NA, K, CL, CO2, GLUCOSE, BUN, CREATININE, CALCIUM in the last 72 hours.   Studies/Results: No results found.  Assessment/Plan: BPH: S/p TURP 09/30/2021  -Pain control prn -Diet as tolerated -Tolerating foley. Plan for void trial tomorrow and discharge home tomorrow -Reviewed labs, appropriate -OOB, amb, IS   LOS: 0 days   Matt R. Addie Cederberg MD 10/01/2021, 10:29 AM [facility]  Pager: [phone]

## 2021-10-02 NOTE — TOC CM/SW Note (Signed)
°  Transition of Care (TOC) Screening Note   Patient Details  Name: Kenneth Everett Date of Birth: [birth year]   Transition of Care [facility]) CM/SW Contact:    Ross Ludwig, LCSW Phone Number: 10/02/2021, 10:50 AM    Transition of Care Department [facility]) has reviewed patient and no TOC needs have been identified at this time. We will continue to monitor patient advancement through interdisciplinary progression rounds. If new patient transition needs arise, please place a TOC consult.

## 2021-10-02 NOTE — Discharge Summary (Signed)
Date of admission: 09/30/2021  Date of discharge: 10/02/2021  Admission diagnosis: BPH  Discharge diagnosis: BPH  Secondary diagnoses: None  History and Physical: For full details, please see admission history and physical. Briefly, Kenneth Everett is a 75 y.o. year old patient with BPH who underwent TURP on 09/30/2021.   Hospital Course: The patient recovered in the usual expected fashion.  He had his diet advanced slowly.  Initially managed with IV pain control, then transitioned to PO meds when he was tolerating oral intake.  His labs were stable throughout the hospital course.  He was discharged to home on POD#2.  At the time of discharge the patient was tolerating a regular diet, passing flatus, ambulating, had adequate pain control and was agreeable to discharge. He passed VT prior to discharge. Follow up as scheduled.    Laboratory values: No results for input(s): HGB, HCT in the last 72 hours. No results for input(s): CREATININE in the last 72 hours.  Disposition: Home  Discharge instruction: The patient was instructed to be ambulatory but told to refrain from heavy lifting, strenuous activity, or driving.  Discharge medications:  Allergies as of 10/02/2021   No Known Allergies      Medication List     TAKE these medications    ascorbic acid 500 MG tablet Commonly known as: VITAMIN C Take 500 mg by mouth daily.   atorvastatin 20 MG tablet Commonly known as: LIPITOR Take 20 mg by mouth daily.   Co Q10 100 MG Caps Take 1 capsule by mouth daily.   finasteride 5 MG tablet Commonly known as: PROSCAR Take 5 mg by mouth daily.   Fish Oil 1000 MG Caps Take 1,000 mg by mouth daily.   Glucosamine 750 MG Tabs Take 1 tablet by mouth in the morning, at noon, and at bedtime.   Lutein 10 MG Tabs Take 1 tablet by mouth daily.   meloxicam 7.5 MG tablet Commonly known as: MOBIC Take 7.5 mg by mouth daily.   Multivitamin Adult Tabs Take 1 tablet by mouth daily.    ondansetron 4 MG disintegrating tablet Commonly known as: Zofran ODT Take 1 tablet (4 mg total) by mouth every 8 (eight) hours as needed for nausea or vomiting.   Turmeric 500 MG Caps Take 1 capsule by mouth in the morning and at bedtime.   VITAMIN B COMPLEX PO Take 1 tablet by mouth daily.   Vitamin D3 50 MCG (2000 UT) Tabs Take 1 tablet by mouth daily.        Followup:   Follow-up Information     [facility] Follow up on 10/14/2021.   Why: 1pm Contact information: [address] [facility]  Pager: [phone]

## 2021-10-02 NOTE — Discharge Instructions (Signed)

## 2021-10-02 NOTE — Progress Notes (Signed)
2 Days Post-Op Subjective: Pain controlled. No nausea or emesis. Had a BM. Tolerating diet. Tolerating foley, clear yellow.  Objective: Vital signs in last 24 hours: Temp:  [97.3 F (36.3 C)-98.8 F (37.1 C)] 97.4 F (36.3 C) (12/18 0557) Pulse Rate:  [63-65] 65 (12/18 0557) Resp:  [18-20] 18 (12/18 0557) BP: (120-137)/(67-82) 122/82 (12/18 0557) SpO2:  [95 %-96 %] 95 % (12/18 0557)  Intake/Output from previous day: 12/17 0701 - 12/18 0700 In: -  Out: 4250 [Urine:4250] Intake/Output this shift: No intake/output data recorded.  Physical Exam:  General: Alert and oriented CV: RRR Lungs: Clear Abdomen: Soft, ND, NT, obese Ext: NT, No erythema  Lab Results: No results for input(s): HGB, HCT in the last 72 hours. BMET No results for input(s): NA, K, CL, CO2, GLUCOSE, BUN, CREATININE, CALCIUM in the last 72 hours.   Studies/Results: No results found.  Assessment/Plan: BPH: S/p TURP 09/30/2021  -Pain control prn -Diet as tolerated -Void trial today -Discharge home after passing void trial   LOS: 1 day   Matt R. Marshall Roehrich MD 10/02/2021, 9:31 AM [facility]  Pager: [phone]

## 2021-10-03 ENCOUNTER — Encounter (HOSPITAL_COMMUNITY): Payer: Self-pay | Admitting: Urology

## 2021-10-03 NOTE — Anesthesia Postprocedure Evaluation (Signed)
Anesthesia Post Note  Patient: Kenneth Everett  Procedure(s) Performed: TRANSURETHRAL RESECTION OF THE PROSTATE (TURP)     Patient location during evaluation: PACU Anesthesia Type: General Level of consciousness: awake and alert Pain management: pain level controlled Vital Signs Assessment: post-procedure vital signs reviewed and stable Respiratory status: spontaneous breathing, nonlabored ventilation, respiratory function stable and patient connected to nasal cannula oxygen Cardiovascular status: blood pressure returned to baseline and stable Postop Assessment: no apparent nausea or vomiting Anesthetic complications: no   No notable events documented.        Effie Berkshire

## 2021-10-11 LAB — SURGICAL PATHOLOGY

## 2021-10-13 DIAGNOSIS — W19XXXA Unspecified fall, initial encounter: Secondary | ICD-10-CM | POA: Diagnosis not present

## 2021-10-13 DIAGNOSIS — N401 Enlarged prostate with lower urinary tract symptoms: Secondary | ICD-10-CM | POA: Diagnosis not present

## 2021-10-14 DIAGNOSIS — R3914 Feeling of incomplete bladder emptying: Secondary | ICD-10-CM | POA: Diagnosis not present

## 2021-10-14 DIAGNOSIS — R350 Frequency of micturition: Secondary | ICD-10-CM | POA: Diagnosis not present

## 2021-10-14 DIAGNOSIS — N3941 Urge incontinence: Secondary | ICD-10-CM | POA: Diagnosis not present

## 2021-10-14 DIAGNOSIS — N401 Enlarged prostate with lower urinary tract symptoms: Secondary | ICD-10-CM | POA: Diagnosis not present

## 2021-11-01 DIAGNOSIS — G4733 Obstructive sleep apnea (adult) (pediatric): Secondary | ICD-10-CM | POA: Diagnosis not present

## 2021-11-04 DIAGNOSIS — L603 Nail dystrophy: Secondary | ICD-10-CM | POA: Diagnosis not present

## 2021-11-04 DIAGNOSIS — L84 Corns and callosities: Secondary | ICD-10-CM | POA: Diagnosis not present

## 2021-11-04 DIAGNOSIS — I739 Peripheral vascular disease, unspecified: Secondary | ICD-10-CM | POA: Diagnosis not present

## 2021-12-07 DIAGNOSIS — R350 Frequency of micturition: Secondary | ICD-10-CM | POA: Diagnosis not present

## 2021-12-07 DIAGNOSIS — N3941 Urge incontinence: Secondary | ICD-10-CM | POA: Diagnosis not present

## 2022-01-02 DIAGNOSIS — Z79899 Other long term (current) drug therapy: Secondary | ICD-10-CM | POA: Diagnosis not present

## 2022-01-02 DIAGNOSIS — R7309 Other abnormal glucose: Secondary | ICD-10-CM | POA: Diagnosis not present

## 2022-01-02 DIAGNOSIS — D696 Thrombocytopenia, unspecified: Secondary | ICD-10-CM | POA: Diagnosis not present

## 2022-01-02 DIAGNOSIS — N3281 Overactive bladder: Secondary | ICD-10-CM | POA: Diagnosis not present

## 2022-01-02 DIAGNOSIS — Z95828 Presence of other vascular implants and grafts: Secondary | ICD-10-CM | POA: Diagnosis not present

## 2022-01-02 DIAGNOSIS — E7800 Pure hypercholesterolemia, unspecified: Secondary | ICD-10-CM | POA: Diagnosis not present

## 2022-01-02 DIAGNOSIS — R600 Localized edema: Secondary | ICD-10-CM | POA: Diagnosis not present

## 2022-01-02 DIAGNOSIS — Z0000 Encounter for general adult medical examination without abnormal findings: Secondary | ICD-10-CM | POA: Diagnosis not present

## 2022-01-02 DIAGNOSIS — M170 Bilateral primary osteoarthritis of knee: Secondary | ICD-10-CM | POA: Diagnosis not present

## 2022-01-11 DIAGNOSIS — R3914 Feeling of incomplete bladder emptying: Secondary | ICD-10-CM | POA: Diagnosis not present

## 2022-01-11 DIAGNOSIS — N3941 Urge incontinence: Secondary | ICD-10-CM | POA: Diagnosis not present

## 2022-01-11 DIAGNOSIS — R351 Nocturia: Secondary | ICD-10-CM | POA: Diagnosis not present

## 2022-01-11 DIAGNOSIS — N401 Enlarged prostate with lower urinary tract symptoms: Secondary | ICD-10-CM | POA: Diagnosis not present

## 2022-02-02 IMAGING — CT CT ABD-PELV W/ CM
2 of 5 series · 17 of 46 positions shown, 19 images · IV contrast (OMNIPAQUE 300)
Comparison: None.

CLINICAL DATA: Left lower quadrant pain

EXAM:
CT ABDOMEN AND PELVIS WITH CONTRAST
TECHNIQUE: Multidetector CT imaging of the abdomen and pelvis was performed
using the standard protocol following bolus administration of
intravenous contrast.
CONTRAST:  100mL OMNIPAQUE IOHEXOL 300 MG/ML  SOLN

[Series 2: axial st · axial · 0.98mm/px · z∈[-532,-68]mm · 14 of 109 slices shown, 16 images]
[im 8/109  soft-tissue]
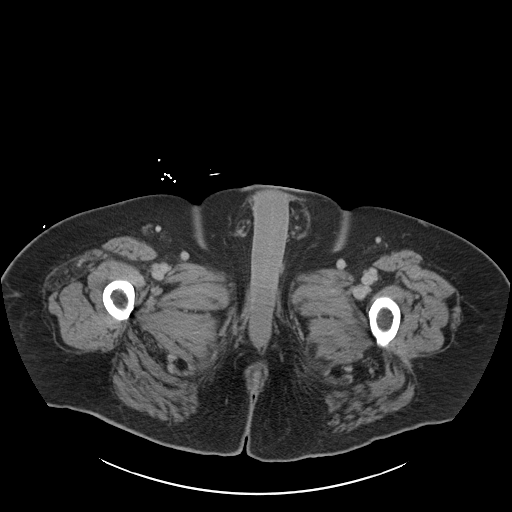
[im 8/109  bone]
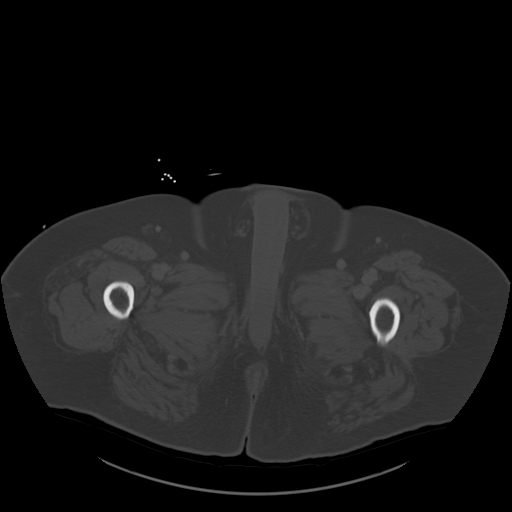
[im 15/109  soft-tissue]
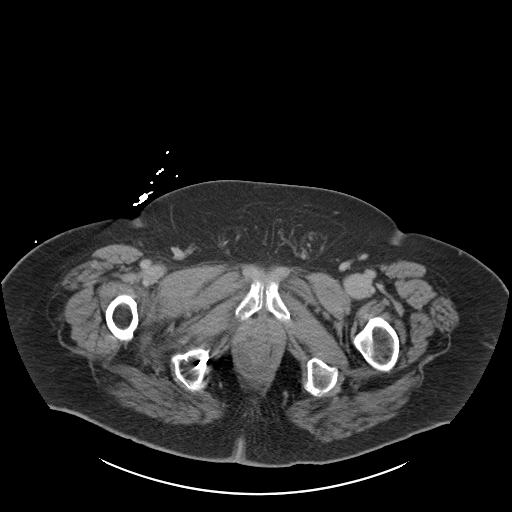
[im 22/109  soft-tissue]
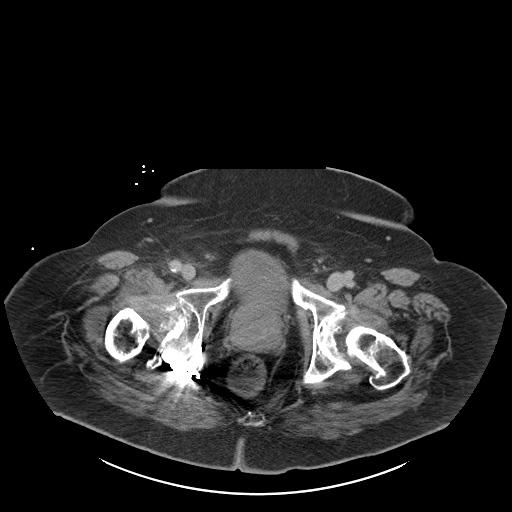
[im 29/109  soft-tissue]
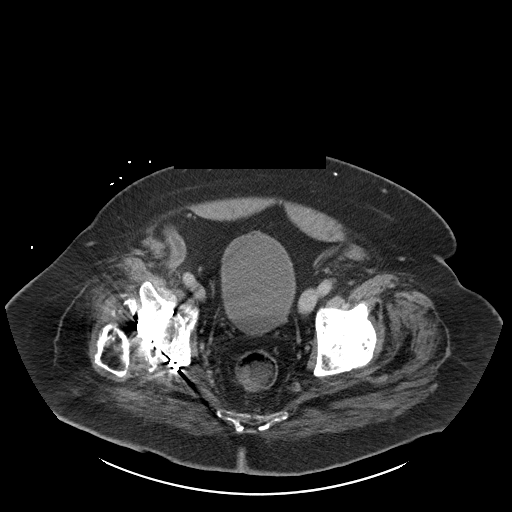
[im 37/109  soft-tissue]
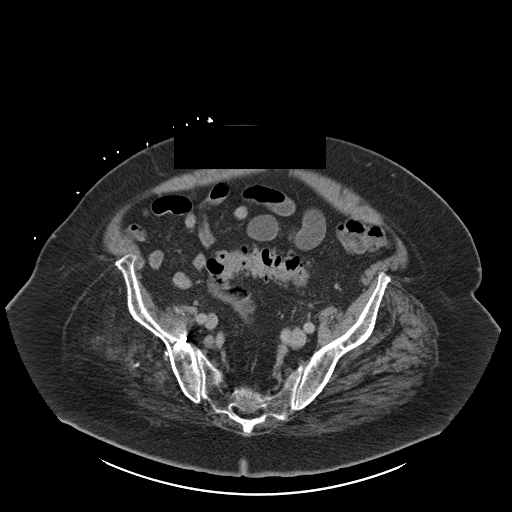
[im 44/109  soft-tissue]
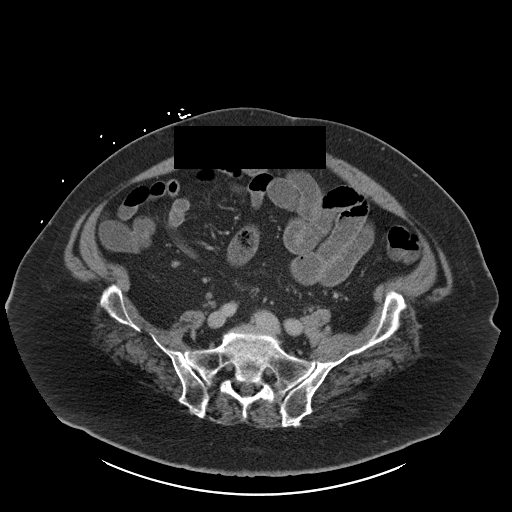
[im 51/109  soft-tissue]
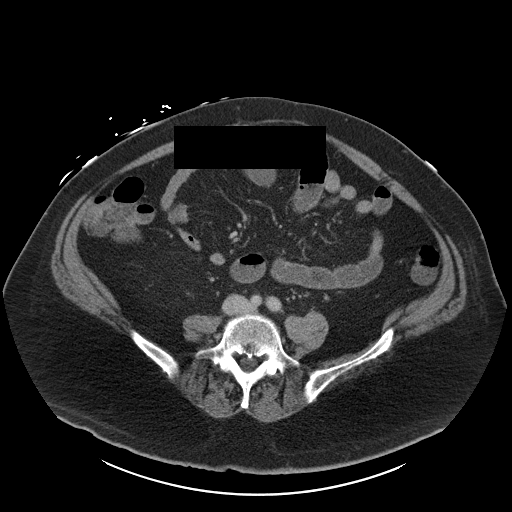
[im 58/109  soft-tissue]
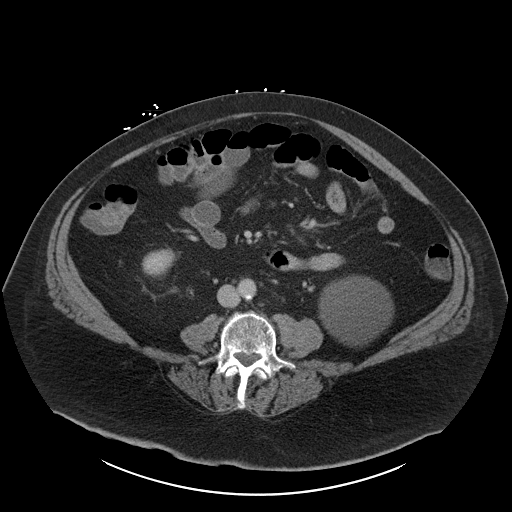
[im 65/109  soft-tissue]
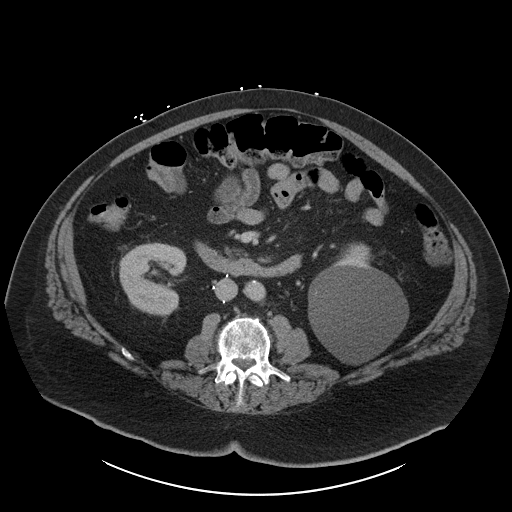
[im 65/109  bone]
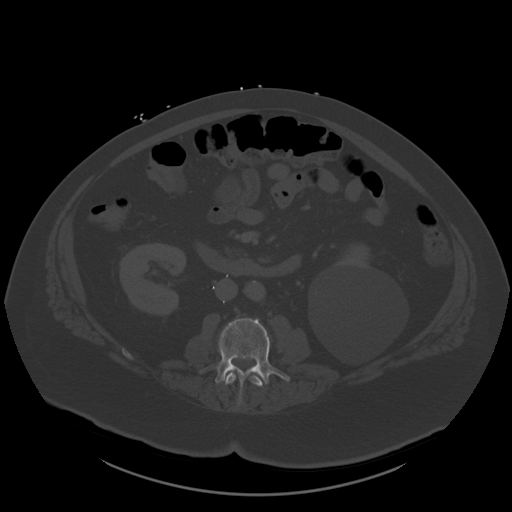
[im 73/109  soft-tissue]
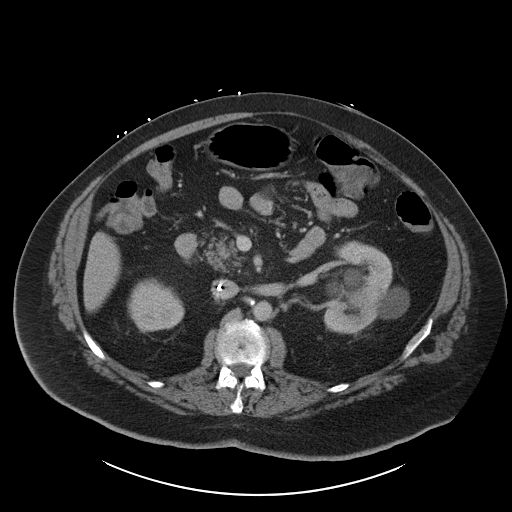
[im 80/109  soft-tissue]
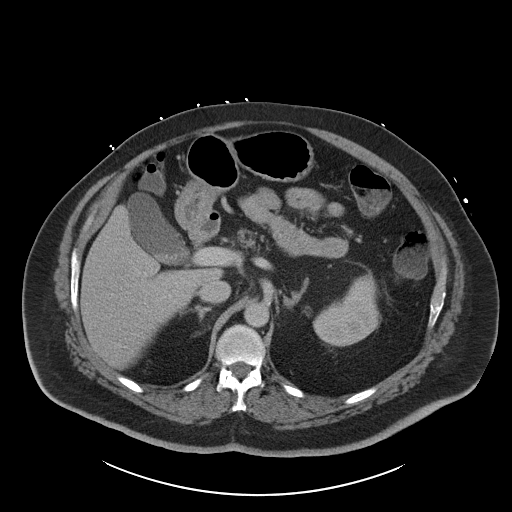
[im 87/109  soft-tissue]
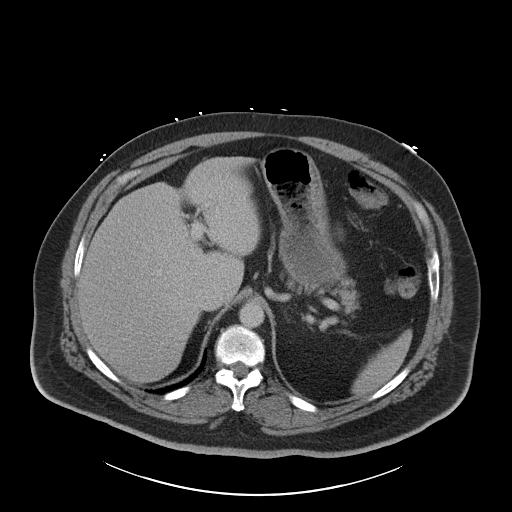
[im 94/109  soft-tissue]
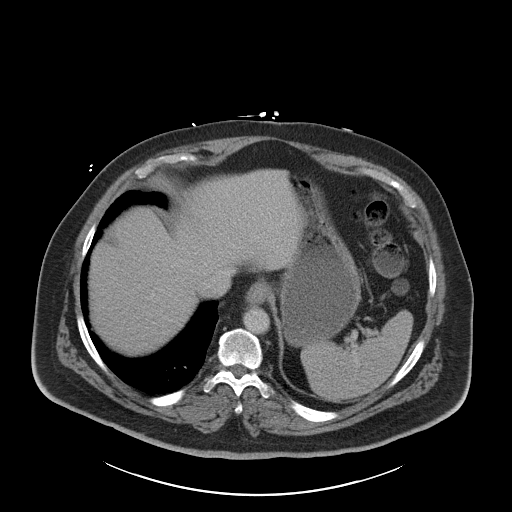
[im 101/109  soft-tissue]
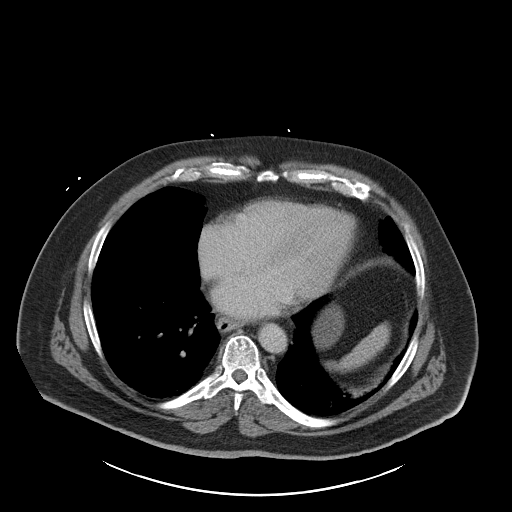

[Series 5: coronal st · coronal · 1.03mm/px · 3 of 198 slices shown]
[im 66/198  soft-tissue]
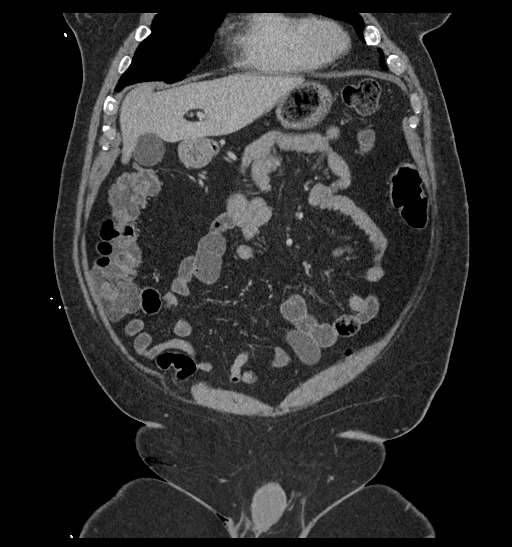
[im 88/198  soft-tissue]
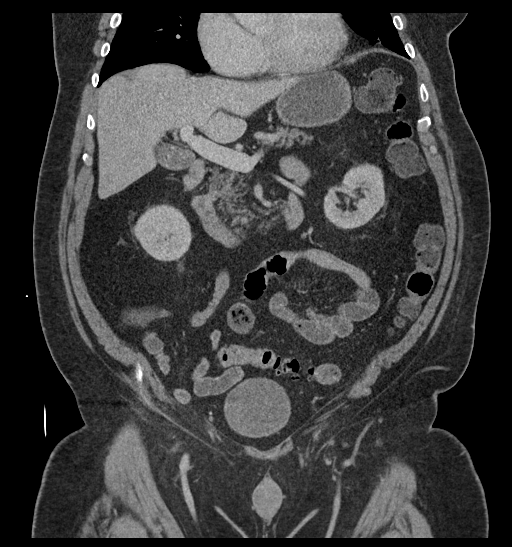
[im 110/198  soft-tissue]
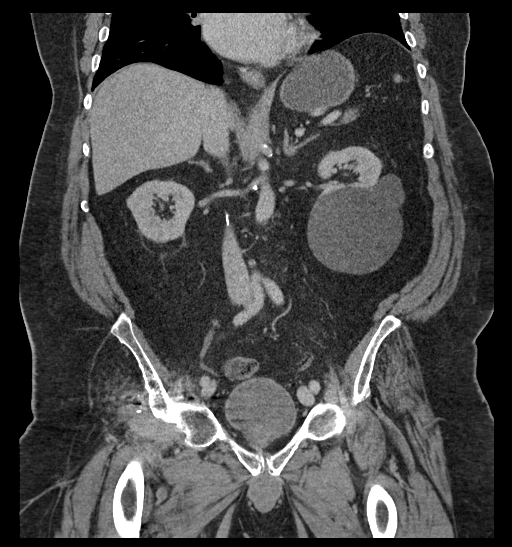

[17 of 46 positions shown; findings below may reference images not displayed]

FINDINGS: Lower chest: No acute abnormality.

Hepatobiliary: Small layering gallstones in the gallbladder. No
focal hepatic abnormality.

Pancreas: No focal abnormality or ductal dilatation.

Spleen: No focal abnormality.  Normal size.

Adrenals/Urinary Tract: Large cyst off the lower pole of the left
kidney measures 9.9 cm. No hydronephrosis or stones. Adrenal glands
and urinary bladder unremarkable.

Stomach/Bowel: Mildly prominent, fluid-filled small bowel loops in
the abdomen and pelvis. No significant caliber change. Favor mild
ileus. Appendix is normal. Stomach and large bowel grossly
unremarkable.

Vascular/Lymphatic: Scattered atherosclerosis. No evidence of
aneurysm or adenopathy. IVC filter in place.

Reproductive: Prostate enlargement.

Other: No free fluid or free air.

Musculoskeletal: No acute bony abnormality.
IMPRESSION: Mildly prominent small bowel loops in the abdomen and pelvis without
well-defined transition or caliber change. Favor mild ileus.

Cholelithiasis.

Prostate enlargement.

## 2022-02-10 DIAGNOSIS — L603 Nail dystrophy: Secondary | ICD-10-CM | POA: Diagnosis not present

## 2022-02-10 DIAGNOSIS — I739 Peripheral vascular disease, unspecified: Secondary | ICD-10-CM | POA: Diagnosis not present

## 2022-02-10 DIAGNOSIS — L84 Corns and callosities: Secondary | ICD-10-CM | POA: Diagnosis not present

## 2022-02-16 DIAGNOSIS — H6123 Impacted cerumen, bilateral: Secondary | ICD-10-CM | POA: Diagnosis not present

## 2022-05-01 DIAGNOSIS — M21371 Foot drop, right foot: Secondary | ICD-10-CM | POA: Diagnosis not present

## 2022-05-12 DIAGNOSIS — L603 Nail dystrophy: Secondary | ICD-10-CM | POA: Diagnosis not present

## 2022-05-12 DIAGNOSIS — L84 Corns and callosities: Secondary | ICD-10-CM | POA: Diagnosis not present

## 2022-05-12 DIAGNOSIS — I739 Peripheral vascular disease, unspecified: Secondary | ICD-10-CM | POA: Diagnosis not present

## 2022-07-05 DIAGNOSIS — Z79899 Other long term (current) drug therapy: Secondary | ICD-10-CM | POA: Diagnosis not present

## 2022-07-05 DIAGNOSIS — Z23 Encounter for immunization: Secondary | ICD-10-CM | POA: Diagnosis not present

## 2022-07-05 DIAGNOSIS — E7800 Pure hypercholesterolemia, unspecified: Secondary | ICD-10-CM | POA: Diagnosis not present

## 2022-07-05 DIAGNOSIS — M21371 Foot drop, right foot: Secondary | ICD-10-CM | POA: Diagnosis not present

## 2022-07-05 DIAGNOSIS — R600 Localized edema: Secondary | ICD-10-CM | POA: Diagnosis not present

## 2022-07-17 DIAGNOSIS — N3941 Urge incontinence: Secondary | ICD-10-CM | POA: Diagnosis not present

## 2022-07-17 DIAGNOSIS — N401 Enlarged prostate with lower urinary tract symptoms: Secondary | ICD-10-CM | POA: Diagnosis not present

## 2022-07-17 DIAGNOSIS — R3914 Feeling of incomplete bladder emptying: Secondary | ICD-10-CM | POA: Diagnosis not present

## 2022-07-17 DIAGNOSIS — R972 Elevated prostate specific antigen [PSA]: Secondary | ICD-10-CM | POA: Diagnosis not present

## 2022-07-26 DIAGNOSIS — Z23 Encounter for immunization: Secondary | ICD-10-CM | POA: Diagnosis not present

## 2022-07-31 DIAGNOSIS — H6123 Impacted cerumen, bilateral: Secondary | ICD-10-CM | POA: Diagnosis not present

## 2022-08-11 DIAGNOSIS — I739 Peripheral vascular disease, unspecified: Secondary | ICD-10-CM | POA: Diagnosis not present

## 2022-08-11 DIAGNOSIS — L603 Nail dystrophy: Secondary | ICD-10-CM | POA: Diagnosis not present

## 2022-08-11 DIAGNOSIS — L84 Corns and callosities: Secondary | ICD-10-CM | POA: Diagnosis not present

## 2022-09-26 ENCOUNTER — Emergency Department (HOSPITAL_COMMUNITY): Payer: Medicare Other

## 2022-09-26 ENCOUNTER — Emergency Department (HOSPITAL_COMMUNITY)
Admission: EM | Admit: 2022-09-26 | Discharge: 2022-09-26 | Disposition: A | Discharge status: Home / Self Care | Payer: Medicare Other | Attending: Emergency Medicine | Admitting: Emergency Medicine

## 2022-09-26 DIAGNOSIS — L03032 Cellulitis of left toe: Secondary | ICD-10-CM | POA: Diagnosis not present

## 2022-09-26 DIAGNOSIS — L03116 Cellulitis of left lower limb: Secondary | ICD-10-CM | POA: Insufficient documentation

## 2022-09-26 DIAGNOSIS — R609 Edema, unspecified: Secondary | ICD-10-CM | POA: Diagnosis not present

## 2022-09-26 DIAGNOSIS — R2243 Localized swelling, mass and lump, lower limb, bilateral: Secondary | ICD-10-CM | POA: Diagnosis not present

## 2022-09-26 DIAGNOSIS — L03031 Cellulitis of right toe: Secondary | ICD-10-CM | POA: Diagnosis not present

## 2022-09-26 DIAGNOSIS — M7989 Other specified soft tissue disorders: Secondary | ICD-10-CM | POA: Diagnosis not present

## 2022-09-26 DIAGNOSIS — R600 Localized edema: Secondary | ICD-10-CM | POA: Diagnosis not present

## 2022-09-26 DIAGNOSIS — L03119 Cellulitis of unspecified part of limb: Secondary | ICD-10-CM | POA: Diagnosis not present

## 2022-09-26 LAB — COMPREHENSIVE METABOLIC PANEL
ALT: 32 U/L (ref 0–44)
AST: 31 U/L (ref 15–41)
Albumin: 3.6 g/dL (ref 3.5–5.0)
Alkaline Phosphatase: 79 U/L (ref 38–126)
Anion gap: 6 (ref 5–15)
BUN: 20 mg/dL (ref 8–23)
CO2: 27 mmol/L (ref 22–32)
Calcium: 8.8 mg/dL — ABNORMAL LOW (ref 8.9–10.3)
Chloride: 107 mmol/L (ref 98–111)
Creatinine, Ser: 0.91 mg/dL (ref 0.61–1.24)
GFR, Estimated: >60 mL/min (ref >60)
Glucose, Bld: 114 mg/dL — ABNORMAL HIGH (ref 70–99)
Potassium: 3.5 mmol/L (ref 3.5–5.1)
Sodium: 140 mmol/L (ref 135–145)
Total Bilirubin: 0.9 mg/dL (ref 0.3–1.2)
Total Protein: 7.3 g/dL (ref 6.5–8.1)

## 2022-09-26 LAB — CBC WITH DIFFERENTIAL/PLATELET
Abs Immature Granulocytes: 0.05 10*3/uL (ref 0.00–0.07)
Basophils Absolute: 0.1 10*3/uL (ref 0.0–0.1)
Basophils Relative: 0 %
Eosinophils Absolute: 0 10*3/uL (ref 0.0–0.5)
Eosinophils Relative: 0 %
HCT: 43.5 % (ref 39.0–52.0)
Hemoglobin: 13.5 g/dL (ref 13.0–17.0)
Immature Granulocytes: 0 %
Lymphocytes Relative: 15 %
Lymphs Abs: 1.7 10*3/uL (ref 0.7–4.0)
MCH: 27.7 pg (ref 26.0–34.0)
MCHC: 31 g/dL (ref 30.0–36.0)
MCV: 89.1 fL (ref 80.0–100.0)
Monocytes Absolute: 1.2 10*3/uL — ABNORMAL HIGH (ref 0.1–1.0)
Monocytes Relative: 11 %
Neutro Abs: 8.2 10*3/uL — ABNORMAL HIGH (ref 1.7–7.7)
Neutrophils Relative %: 74 %
Platelets: 197 10*3/uL (ref 150–400)
RBC: 4.88 MIL/uL (ref 4.22–5.81)
RDW: 14.1 % (ref 11.5–15.5)
WBC: 11.2 10*3/uL — ABNORMAL HIGH (ref 4.0–10.5)
nRBC: 0 % (ref 0.0–0.2)

## 2022-09-26 MED ORDER — SULFAMETHOXAZOLE-TRIMETHOPRIM 800-160 MG PO TABS: 1.0000 | ORAL_TABLET | Freq: Two times a day (BID) | ORAL | 0 refills | Status: AC

## 2022-09-26 MED ORDER — SULFAMETHOXAZOLE-TRIMETHOPRIM 800-160 MG PO TABS
1.0000 | ORAL_TABLET | Freq: Once | ORAL | Status: AC
  Administered 2022-09-26: 1 via ORAL
  Filled 2022-09-26: qty 1

## 2022-09-26 NOTE — Discharge Instructions (Addendum)
You were seen in the emergency department for your leg swelling.  It appears that you have a skin infection around the wounds on your right toe and your left shin.  We have given you antibiotics and you should complete these as prescribed.  You should do daily dressing changes to help your wounds stay clean and heal appropriately and you should wear compression stockings to help with the swelling.  You should follow-up with your primary doctor in 2 to 3 days to have your wound rechecked.  You should return to the emergency department if you are having streaking redness up your legs, you are having fevers, you are vomiting and unable to tolerate the antibiotics or if you have any other new or concerning symptoms.

## 2022-09-26 NOTE — ED Triage Notes (Signed)
PTAR reports pt coming from doctors office for leg swelling and drainage. Doctor wanted pt evaluated at hospital.

## 2022-09-26 NOTE — ED Provider Notes (Signed)
[facility] DEPT Provider Note   CSN: [id number] Arrival date & time: 09/26/22  1146     History  Chief Complaint  Patient presents with   Leg Swelling    Kenneth Everett is a 76 y.o. male.  Patient is a 76 year old male with a past medical history of obesity, hyperlipidemia and BPH presenting to the emergency department with bilateral lower extremity swelling.  Patient states that he woke up on Monday morning with swelling to his bilateral legs as well as increased redness.  He states that he tried to place dressings at home to help with the swelling however was not able to put on his shoes.  He states that he went to his primary doctor's office this morning who recommended that he come to the ER for further evaluation.  He states that he is having some pain in his right foot but no pain in his left leg.  He denies any chest pain or shortness of breath.  He denies any fevers or chills, nausea or vomiting.  He states that he has had swelling in his legs in the past but never knew the etiology.  The history is provided by the patient.       Home Medications Prior to Admission medications   Medication Sig Start Date End Date Taking? Authorizing Provider  sulfamethoxazole-trimethoprim (BACTRIM DS) 800-160 MG tablet Take 1 tablet by mouth 2 (two) times daily for 7 days. 09/26/22 10/03/22 Yes Leanord Asal K, DO  ascorbic acid (VITAMIN C) 500 MG tablet Take 500 mg by mouth daily.    [provider]  atorvastatin (LIPITOR) 20 MG tablet Take 20 mg by mouth daily. 07/25/21   [provider]  B Complex Vitamins (VITAMIN B COMPLEX PO) Take 1 tablet by mouth daily.    [provider]  Cholecalciferol (VITAMIN D3) 50 MCG (2000 UT) TABS Take 1 tablet by mouth daily.    [provider]  Coenzyme Q10 (CO Q10) 100 MG CAPS Take 1 capsule by mouth daily.    [provider]  finasteride (PROSCAR) 5 MG tablet Take 5 mg by mouth  daily. 07/23/21   [provider]  Glucosamine 750 MG TABS Take 1 tablet by mouth in the morning, at noon, and at bedtime.    [provider]  Lutein 10 MG TABS Take 1 tablet by mouth daily.    [provider]  meloxicam (MOBIC) 7.5 MG tablet Take 7.5 mg by mouth daily.    [provider]  Multiple Vitamin (MULTIVITAMIN ADULT) TABS Take 1 tablet by mouth daily.    [provider]  Omega-3 Fatty Acids (FISH OIL) 1000 MG CAPS Take 1,000 mg by mouth daily.    [provider]  ondansetron (ZOFRAN ODT) 4 MG disintegrating tablet Take 1 tablet (4 mg total) by mouth every 8 (eight) hours as needed for nausea or vomiting. Patient not taking: Reported on 09/23/2021 01/31/21   Petrucelli, Glynda Jaeger, PA-C  Turmeric 500 MG CAPS Take 1 capsule by mouth in the morning and at bedtime.    [provider]      Allergies    Patient has no known allergies.    Review of Systems   Review of Systems  Physical Exam Updated Vital Signs BP (!) 159/90   Pulse (!) 46   Temp 97.6 F (36.4 C) (Oral)   Resp 17   SpO2 97%  Physical Exam Vitals and nursing note reviewed.  Constitutional:  General: He is not in acute distress.    Appearance: Normal appearance. He is obese.  HENT:     Head: Normocephalic and atraumatic.     Nose: Nose normal.     Mouth/Throat:     Mouth: Mucous membranes are moist.     Pharynx: Oropharynx is clear.  Eyes:     Extraocular Movements: Extraocular movements intact.     Conjunctiva/sclera: Conjunctivae normal.  Cardiovascular:     Rate and Rhythm: Normal rate and regular rhythm.     Heart sounds: Murmur (systolic) heard.  Pulmonary:     Effort: Pulmonary effort is normal.  Abdominal:     General: Abdomen is flat.     Palpations: Abdomen is soft.     Tenderness: There is no abdominal tenderness.  Musculoskeletal:        General: Tenderness (R great toe) present. Normal range of motion.     Cervical back:  Normal range of motion and neck supple.     Right lower leg: Edema (3 + to knee) present.     Left lower leg: Edema (3 + to knee) present.  Skin:    General: Skin is warm and dry.     Comments: ~1 cm ulceration with surrounding erythema and warmth to R great toe with small amount of purulent drainage ~ 3 cm superficial ulceration to L shin without purulent drainage Significant erythema with warmth to touch of bilateral shins  Neurological:     General: No focal deficit present.     Mental Status: He is alert and oriented to person, place, and time.  Psychiatric:        Mood and Affect: Mood normal.        Behavior: Behavior normal.        ED Results / Procedures / Treatments   Labs (all labs ordered are listed, but only abnormal results are displayed) Labs Reviewed  COMPREHENSIVE METABOLIC PANEL - Abnormal; Notable for the following components:      Result Value   Glucose, Bld 114 (*)    Calcium 8.8 (*)    All other components within normal limits  CBC WITH DIFFERENTIAL/PLATELET - Abnormal; Notable for the following components:   WBC 11.2 (*)    Neutro Abs 8.2 (*)    Monocytes Absolute 1.2 (*)    All other components within normal limits    EKG None  Radiology DG Tibia/Fibula Right  Result Date: 09/26/2022 CLINICAL DATA:  Bilateral lower extremity swelling and draining wounds including the lower half of the bilateral legs and the anterior right foot. EXAM: RIGHT TIBIA AND FIBULA - 2 VIEW; RIGHT FOOT COMPLETE - 3+ VIEW COMPARISON:  None Available. FINDINGS: Right tibia and fibula: Mildly decreased bone mineralization. Severe lateral compartment of the knee joint space narrowing with approximately 5 mm lateralization of the tibia with respect of the distal femur. Small plantar calcaneal heel spur. Diffuse subcutaneous fat edema and soft tissue swelling throughout the right calf and dorsal right foot, high-grade at the dorsal aspect of the metatarsals. Right foot: Three screws  overlie the region of the first and second tarsometatarsal joints, likely first and second tarsometatarsal arthrodesis and fusion across the Lisfranc interval. Moderate interphalangeal joint space narrowing diffusely. Moderate great toe interphalangeal peripheral degenerative osteophytes. Evaluation is limited by obliquity on the provided views. No acute fracture is seen.  No dislocation.  No cortical erosion. No subcutaneous air. IMPRESSION: 1. 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 2. Diffuse soft tissue swelling  throughout the right calf and dorsal right foot, high-grade at the dorsal aspect of the metatarsals. 3. Status post first and second tarsometatarsal arthrodesis. 4. No radiographic evidence of acute osteomyelitis. Electronically Signed   By: Yvonne Kendall M.D.   On: 09/26/2022 13:12   DG Foot Complete Right  Result Date: 09/26/2022 CLINICAL DATA:  Bilateral lower extremity swelling and draining wounds including the lower half of the bilateral legs and the anterior right foot. EXAM: RIGHT TIBIA AND FIBULA - 2 VIEW; RIGHT FOOT COMPLETE - 3+ VIEW COMPARISON:  None Available. FINDINGS: Right tibia and fibula: Mildly decreased bone mineralization. Severe lateral compartment of the knee joint space narrowing with approximately 5 mm lateralization of the tibia with respect of the distal femur. Small plantar calcaneal heel spur. Diffuse subcutaneous fat edema and soft tissue swelling throughout the right calf and dorsal right foot, high-grade at the dorsal aspect of the metatarsals. Right foot: Three screws overlie the region of the first and second tarsometatarsal joints, likely first and second tarsometatarsal arthrodesis and fusion across the Lisfranc interval. Moderate interphalangeal joint space narrowing diffusely. Moderate great toe interphalangeal peripheral degenerative osteophytes. Evaluation is limited by obliquity on the provided views. No acute fracture is seen.  No dislocation.   No cortical erosion. No subcutaneous air. IMPRESSION: 1. 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 2. Diffuse soft tissue swelling throughout the right calf and dorsal right foot, high-grade at the dorsal aspect of the metatarsals. 3. Status post first and second tarsometatarsal arthrodesis. 4. No radiographic evidence of acute osteomyelitis. Electronically Signed   By: Yvonne Kendall M.D.   On: 09/26/2022 13:12   DG Tibia/Fibula Left  Result Date: 09/26/2022 CLINICAL DATA:  Lower extremity wound and swelling. Bilateral lower leg swelling and draining wounds. EXAM: LEFT TIBIA AND FIBULA - 2 VIEW COMPARISON:  None Available. FINDINGS: There is mildly decreased bone mineralization. Severe medial compartment of the knee joint space narrowing and bone-on-bone contact. Small plantar calcaneal heel spur. Mild-to-moderate talonavicular joint space narrowing with mild dorsal degenerative osteophytosis. No acute fracture or dislocation. No cortical erosion. No subcutaneous air. IMPRESSION: 1. No radiographic evidence of osteomyelitis. 2. 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 3. Small plantar calcaneal heel spur. Electronically Signed   By: Yvonne Kendall M.D.   On: 09/26/2022 13:08    Procedures Procedures    Medications Ordered in ED Medications  sulfamethoxazole-trimethoprim (BACTRIM DS) 800-160 MG per tablet 1 tablet (has no administration in time range)    ED Course/ Medical Decision Making/ A&P                           Medical Decision Making This patient presents to the ED with chief complaint(s) of bilateral LE swelling with pertinent past medical history of obesity, HLD, BPH which further complicates the presenting complaint. The complaint involves an extensive differential diagnosis and also carries with it a high risk of complications and morbidity.    The differential diagnosis includes concern for cellulitis of bilateral lower extremity, possible osteomyelitis of  the underlying wounds, no signs of sepsis on exam, no shortness of breath or crackles on exam making CHF unlikely, considering kidney or liver dysfunction causing lower extremity swelling  Additional history obtained: Additional history obtained from N/A Records reviewed previous admission documents  ED Course and Reassessment: Patient was hemodynamically stable on arrival though has significant edema with wounds to bilateral legs.  He will have x-rays and labs performed to  evaluate for deep space infection or liver or kidney dysfunction as a cause of his swelling.  He has evidence of cellulitis on exam will require treatment.  Independent labs interpretation:  The following labs were independently interpreted: mild leukocytosis, otherwise within normal range  Independent visualization of imaging: - I independently visualized the following imaging with scope of interpretation limited to determining acute life threatening conditions related to emergency care: bilateral tib/fib and R foot XR, which revealed soft tissue swelling, no signs of deep space infection  Consultation: - Consulted or discussed management/test interpretation w/ external professional: N/A  Consideration for admission or further workup: Patient has no emergent conditions requiring admission or further work-up at this time and is stable for discharge home with primary care follow-up  Social Determinants of health: N/A    Amount and/or Complexity of Data Reviewed Labs: ordered. Radiology: ordered.  Risk Prescription drug management.          Final Clinical Impression(s) / ED Diagnoses Final diagnoses:  Cellulitis of toe of right foot  Cellulitis of left lower extremity  Peripheral edema    Rx / DC Orders ED Discharge Orders          Ordered    sulfamethoxazole-trimethoprim (BACTRIM DS) 800-160 MG tablet  2 times daily        09/26/22 1411    Compression stockings        09/26/22 1412               Kemper Durie, DO 09/26/22 1417

## 2022-09-27 DIAGNOSIS — R600 Localized edema: Secondary | ICD-10-CM | POA: Diagnosis not present

## 2022-09-27 DIAGNOSIS — L03119 Cellulitis of unspecified part of limb: Secondary | ICD-10-CM | POA: Diagnosis not present

## 2022-09-28 DIAGNOSIS — M170 Bilateral primary osteoarthritis of knee: Secondary | ICD-10-CM | POA: Diagnosis not present

## 2022-09-28 DIAGNOSIS — R350 Frequency of micturition: Secondary | ICD-10-CM | POA: Diagnosis not present

## 2022-09-28 DIAGNOSIS — R7301 Impaired fasting glucose: Secondary | ICD-10-CM | POA: Diagnosis not present

## 2022-09-28 DIAGNOSIS — L03115 Cellulitis of right lower limb: Secondary | ICD-10-CM | POA: Diagnosis not present

## 2022-09-28 DIAGNOSIS — N401 Enlarged prostate with lower urinary tract symptoms: Secondary | ICD-10-CM | POA: Diagnosis not present

## 2022-09-28 DIAGNOSIS — M1612 Unilateral primary osteoarthritis, left hip: Secondary | ICD-10-CM | POA: Diagnosis not present

## 2022-09-28 DIAGNOSIS — Z4800 Encounter for change or removal of nonsurgical wound dressing: Secondary | ICD-10-CM | POA: Diagnosis not present

## 2022-09-28 DIAGNOSIS — D696 Thrombocytopenia, unspecified: Secondary | ICD-10-CM | POA: Diagnosis not present

## 2022-09-28 DIAGNOSIS — L03116 Cellulitis of left lower limb: Secondary | ICD-10-CM | POA: Diagnosis not present

## 2022-09-28 DIAGNOSIS — Z6836 Body mass index (BMI) 36.0-36.9, adult: Secondary | ICD-10-CM | POA: Diagnosis not present

## 2022-09-28 DIAGNOSIS — G4733 Obstructive sleep apnea (adult) (pediatric): Secondary | ICD-10-CM | POA: Diagnosis not present

## 2022-09-28 DIAGNOSIS — R238 Other skin changes: Secondary | ICD-10-CM | POA: Diagnosis not present

## 2022-09-28 DIAGNOSIS — M21371 Foot drop, right foot: Secondary | ICD-10-CM | POA: Diagnosis not present

## 2022-09-28 DIAGNOSIS — E7800 Pure hypercholesterolemia, unspecified: Secondary | ICD-10-CM | POA: Diagnosis not present

## 2022-09-28 DIAGNOSIS — Z9181 History of falling: Secondary | ICD-10-CM | POA: Diagnosis not present

## 2022-09-28 DIAGNOSIS — K5790 Diverticulosis of intestine, part unspecified, without perforation or abscess without bleeding: Secondary | ICD-10-CM | POA: Diagnosis not present

## 2022-10-01 DIAGNOSIS — R238 Other skin changes: Secondary | ICD-10-CM | POA: Diagnosis not present

## 2022-10-01 DIAGNOSIS — K5790 Diverticulosis of intestine, part unspecified, without perforation or abscess without bleeding: Secondary | ICD-10-CM | POA: Diagnosis not present

## 2022-10-01 DIAGNOSIS — M1612 Unilateral primary osteoarthritis, left hip: Secondary | ICD-10-CM | POA: Diagnosis not present

## 2022-10-01 DIAGNOSIS — M170 Bilateral primary osteoarthritis of knee: Secondary | ICD-10-CM | POA: Diagnosis not present

## 2022-10-01 DIAGNOSIS — L03115 Cellulitis of right lower limb: Secondary | ICD-10-CM | POA: Diagnosis not present

## 2022-10-01 DIAGNOSIS — L03116 Cellulitis of left lower limb: Secondary | ICD-10-CM | POA: Diagnosis not present

## 2022-10-02 DIAGNOSIS — L03119 Cellulitis of unspecified part of limb: Secondary | ICD-10-CM | POA: Diagnosis not present

## 2022-10-02 DIAGNOSIS — Z7409 Other reduced mobility: Secondary | ICD-10-CM | POA: Diagnosis not present

## 2022-10-02 DIAGNOSIS — R600 Localized edema: Secondary | ICD-10-CM | POA: Diagnosis not present

## 2022-10-02 DIAGNOSIS — M21371 Foot drop, right foot: Secondary | ICD-10-CM | POA: Diagnosis not present

## 2022-10-04 DIAGNOSIS — K5790 Diverticulosis of intestine, part unspecified, without perforation or abscess without bleeding: Secondary | ICD-10-CM | POA: Diagnosis not present

## 2022-10-04 DIAGNOSIS — M170 Bilateral primary osteoarthritis of knee: Secondary | ICD-10-CM | POA: Diagnosis not present

## 2022-10-04 DIAGNOSIS — L03116 Cellulitis of left lower limb: Secondary | ICD-10-CM | POA: Diagnosis not present

## 2022-10-04 DIAGNOSIS — M1612 Unilateral primary osteoarthritis, left hip: Secondary | ICD-10-CM | POA: Diagnosis not present

## 2022-10-04 DIAGNOSIS — L03115 Cellulitis of right lower limb: Secondary | ICD-10-CM | POA: Diagnosis not present

## 2022-10-04 DIAGNOSIS — R238 Other skin changes: Secondary | ICD-10-CM | POA: Diagnosis not present

## 2022-10-05 DIAGNOSIS — L03115 Cellulitis of right lower limb: Secondary | ICD-10-CM | POA: Diagnosis not present

## 2022-10-05 DIAGNOSIS — L03116 Cellulitis of left lower limb: Secondary | ICD-10-CM | POA: Diagnosis not present

## 2022-10-05 DIAGNOSIS — M1612 Unilateral primary osteoarthritis, left hip: Secondary | ICD-10-CM | POA: Diagnosis not present

## 2022-10-05 DIAGNOSIS — R238 Other skin changes: Secondary | ICD-10-CM | POA: Diagnosis not present

## 2022-10-05 DIAGNOSIS — K5790 Diverticulosis of intestine, part unspecified, without perforation or abscess without bleeding: Secondary | ICD-10-CM | POA: Diagnosis not present

## 2022-10-05 DIAGNOSIS — M170 Bilateral primary osteoarthritis of knee: Secondary | ICD-10-CM | POA: Diagnosis not present

## 2022-10-06 DIAGNOSIS — M170 Bilateral primary osteoarthritis of knee: Secondary | ICD-10-CM | POA: Diagnosis not present

## 2022-10-06 DIAGNOSIS — M1612 Unilateral primary osteoarthritis, left hip: Secondary | ICD-10-CM | POA: Diagnosis not present

## 2022-10-06 DIAGNOSIS — Z7409 Other reduced mobility: Secondary | ICD-10-CM | POA: Diagnosis not present

## 2022-10-06 DIAGNOSIS — K5790 Diverticulosis of intestine, part unspecified, without perforation or abscess without bleeding: Secondary | ICD-10-CM | POA: Diagnosis not present

## 2022-10-06 DIAGNOSIS — L03116 Cellulitis of left lower limb: Secondary | ICD-10-CM | POA: Diagnosis not present

## 2022-10-06 DIAGNOSIS — R238 Other skin changes: Secondary | ICD-10-CM | POA: Diagnosis not present

## 2022-10-06 DIAGNOSIS — L03115 Cellulitis of right lower limb: Secondary | ICD-10-CM | POA: Diagnosis not present

## 2022-10-06 DIAGNOSIS — R600 Localized edema: Secondary | ICD-10-CM | POA: Diagnosis not present

## 2022-10-06 DIAGNOSIS — L03119 Cellulitis of unspecified part of limb: Secondary | ICD-10-CM | POA: Diagnosis not present

## 2022-10-10 DIAGNOSIS — R238 Other skin changes: Secondary | ICD-10-CM | POA: Diagnosis not present

## 2022-10-10 DIAGNOSIS — M1612 Unilateral primary osteoarthritis, left hip: Secondary | ICD-10-CM | POA: Diagnosis not present

## 2022-10-10 DIAGNOSIS — L03115 Cellulitis of right lower limb: Secondary | ICD-10-CM | POA: Diagnosis not present

## 2022-10-10 DIAGNOSIS — M170 Bilateral primary osteoarthritis of knee: Secondary | ICD-10-CM | POA: Diagnosis not present

## 2022-10-10 DIAGNOSIS — K5790 Diverticulosis of intestine, part unspecified, without perforation or abscess without bleeding: Secondary | ICD-10-CM | POA: Diagnosis not present

## 2022-10-10 DIAGNOSIS — L03116 Cellulitis of left lower limb: Secondary | ICD-10-CM | POA: Diagnosis not present

## 2022-10-12 DIAGNOSIS — M170 Bilateral primary osteoarthritis of knee: Secondary | ICD-10-CM | POA: Diagnosis not present

## 2022-10-12 DIAGNOSIS — L03115 Cellulitis of right lower limb: Secondary | ICD-10-CM | POA: Diagnosis not present

## 2022-10-12 DIAGNOSIS — L03116 Cellulitis of left lower limb: Secondary | ICD-10-CM | POA: Diagnosis not present

## 2022-10-12 DIAGNOSIS — M1612 Unilateral primary osteoarthritis, left hip: Secondary | ICD-10-CM | POA: Diagnosis not present

## 2022-10-12 DIAGNOSIS — R238 Other skin changes: Secondary | ICD-10-CM | POA: Diagnosis not present

## 2022-10-12 DIAGNOSIS — K5790 Diverticulosis of intestine, part unspecified, without perforation or abscess without bleeding: Secondary | ICD-10-CM | POA: Diagnosis not present

## 2022-10-13 DIAGNOSIS — M170 Bilateral primary osteoarthritis of knee: Secondary | ICD-10-CM | POA: Diagnosis not present

## 2022-10-13 DIAGNOSIS — L03115 Cellulitis of right lower limb: Secondary | ICD-10-CM | POA: Diagnosis not present

## 2022-10-13 DIAGNOSIS — L03116 Cellulitis of left lower limb: Secondary | ICD-10-CM | POA: Diagnosis not present

## 2022-10-13 DIAGNOSIS — M1612 Unilateral primary osteoarthritis, left hip: Secondary | ICD-10-CM | POA: Diagnosis not present

## 2022-10-13 DIAGNOSIS — R238 Other skin changes: Secondary | ICD-10-CM | POA: Diagnosis not present

## 2022-10-13 DIAGNOSIS — K5790 Diverticulosis of intestine, part unspecified, without perforation or abscess without bleeding: Secondary | ICD-10-CM | POA: Diagnosis not present

## 2022-10-17 DIAGNOSIS — R238 Other skin changes: Secondary | ICD-10-CM | POA: Diagnosis not present

## 2022-10-17 DIAGNOSIS — M1612 Unilateral primary osteoarthritis, left hip: Secondary | ICD-10-CM | POA: Diagnosis not present

## 2022-10-17 DIAGNOSIS — L03116 Cellulitis of left lower limb: Secondary | ICD-10-CM | POA: Diagnosis not present

## 2022-10-17 DIAGNOSIS — L03115 Cellulitis of right lower limb: Secondary | ICD-10-CM | POA: Diagnosis not present

## 2022-10-17 DIAGNOSIS — K5790 Diverticulosis of intestine, part unspecified, without perforation or abscess without bleeding: Secondary | ICD-10-CM | POA: Diagnosis not present

## 2022-10-17 DIAGNOSIS — M170 Bilateral primary osteoarthritis of knee: Secondary | ICD-10-CM | POA: Diagnosis not present

## 2022-10-18 DIAGNOSIS — M1612 Unilateral primary osteoarthritis, left hip: Secondary | ICD-10-CM | POA: Diagnosis not present

## 2022-10-18 DIAGNOSIS — R238 Other skin changes: Secondary | ICD-10-CM | POA: Diagnosis not present

## 2022-10-18 DIAGNOSIS — M170 Bilateral primary osteoarthritis of knee: Secondary | ICD-10-CM | POA: Diagnosis not present

## 2022-10-18 DIAGNOSIS — L03116 Cellulitis of left lower limb: Secondary | ICD-10-CM | POA: Diagnosis not present

## 2022-10-18 DIAGNOSIS — K5790 Diverticulosis of intestine, part unspecified, without perforation or abscess without bleeding: Secondary | ICD-10-CM | POA: Diagnosis not present

## 2022-10-18 DIAGNOSIS — L03115 Cellulitis of right lower limb: Secondary | ICD-10-CM | POA: Diagnosis not present

## 2022-10-20 DIAGNOSIS — L03115 Cellulitis of right lower limb: Secondary | ICD-10-CM | POA: Diagnosis not present

## 2022-10-20 DIAGNOSIS — R238 Other skin changes: Secondary | ICD-10-CM | POA: Diagnosis not present

## 2022-10-20 DIAGNOSIS — K5790 Diverticulosis of intestine, part unspecified, without perforation or abscess without bleeding: Secondary | ICD-10-CM | POA: Diagnosis not present

## 2022-10-20 DIAGNOSIS — L03116 Cellulitis of left lower limb: Secondary | ICD-10-CM | POA: Diagnosis not present

## 2022-10-20 DIAGNOSIS — M1612 Unilateral primary osteoarthritis, left hip: Secondary | ICD-10-CM | POA: Diagnosis not present

## 2022-10-20 DIAGNOSIS — M170 Bilateral primary osteoarthritis of knee: Secondary | ICD-10-CM | POA: Diagnosis not present

## 2022-10-20 DIAGNOSIS — L97512 Non-pressure chronic ulcer of other part of right foot with fat layer exposed: Secondary | ICD-10-CM | POA: Diagnosis not present

## 2022-10-20 DIAGNOSIS — R600 Localized edema: Secondary | ICD-10-CM | POA: Diagnosis not present

## 2022-10-20 DIAGNOSIS — M21371 Foot drop, right foot: Secondary | ICD-10-CM | POA: Diagnosis not present

## 2022-10-23 ENCOUNTER — Other Ambulatory Visit: Payer: Self-pay | Admitting: *Deleted

## 2022-10-23 DIAGNOSIS — M79673 Pain in unspecified foot: Secondary | ICD-10-CM

## 2022-10-23 DIAGNOSIS — L03116 Cellulitis of left lower limb: Secondary | ICD-10-CM | POA: Diagnosis not present

## 2022-10-23 DIAGNOSIS — K5790 Diverticulosis of intestine, part unspecified, without perforation or abscess without bleeding: Secondary | ICD-10-CM | POA: Diagnosis not present

## 2022-10-23 DIAGNOSIS — R238 Other skin changes: Secondary | ICD-10-CM | POA: Diagnosis not present

## 2022-10-23 DIAGNOSIS — M170 Bilateral primary osteoarthritis of knee: Secondary | ICD-10-CM | POA: Diagnosis not present

## 2022-10-23 DIAGNOSIS — L03115 Cellulitis of right lower limb: Secondary | ICD-10-CM | POA: Diagnosis not present

## 2022-10-23 DIAGNOSIS — M1612 Unilateral primary osteoarthritis, left hip: Secondary | ICD-10-CM | POA: Diagnosis not present
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
## 2022-10-24 ENCOUNTER — Ambulatory Visit (INDEPENDENT_AMBULATORY_CARE_PROVIDER_SITE_OTHER): Payer: Medicare Other | Admitting: Vascular Surgery

## 2022-10-24 ENCOUNTER — Ambulatory Visit (HOSPITAL_COMMUNITY)
Admission: RE | Admit: 2022-10-24 | Discharge: 2022-10-24 | Disposition: A | Discharge status: Home / Self Care | Payer: Medicare Other | Source: Ambulatory Visit | Attending: Physician Assistant | Admitting: Physician Assistant

## 2022-10-24 ENCOUNTER — Encounter: Payer: Self-pay | Admitting: Vascular Surgery

## 2022-10-24 VITALS — BP 131/78 | HR 73 | Temp 98.2°F | Resp 20 | Ht 73.0 in | Wt 277.0 lb

## 2022-10-24 DIAGNOSIS — L03115 Cellulitis of right lower limb: Secondary | ICD-10-CM | POA: Diagnosis not present

## 2022-10-24 DIAGNOSIS — M170 Bilateral primary osteoarthritis of knee: Secondary | ICD-10-CM | POA: Diagnosis not present

## 2022-10-24 DIAGNOSIS — M79673 Pain in unspecified foot: Secondary | ICD-10-CM

## 2022-10-24 DIAGNOSIS — R238 Other skin changes: Secondary | ICD-10-CM | POA: Diagnosis not present

## 2022-10-24 DIAGNOSIS — L97909 Non-pressure chronic ulcer of unspecified part of unspecified lower leg with unspecified severity: Secondary | ICD-10-CM

## 2022-10-24 DIAGNOSIS — M1612 Unilateral primary osteoarthritis, left hip: Secondary | ICD-10-CM | POA: Diagnosis not present

## 2022-10-24 DIAGNOSIS — L03116 Cellulitis of left lower limb: Secondary | ICD-10-CM | POA: Diagnosis not present

## 2022-10-24 DIAGNOSIS — I87319 Chronic venous hypertension (idiopathic) with ulcer of unspecified lower extremity: Secondary | ICD-10-CM | POA: Diagnosis not present

## 2022-10-24 DIAGNOSIS — K5790 Diverticulosis of intestine, part unspecified, without perforation or abscess without bleeding: Secondary | ICD-10-CM | POA: Diagnosis not present

## 2022-10-25 LAB — VAS US ABI WITH/WO TBI
Left ABI: 1.36
Right ABI: 1.26

## 2022-10-27 DIAGNOSIS — M170 Bilateral primary osteoarthritis of knee: Secondary | ICD-10-CM | POA: Diagnosis not present

## 2022-10-27 DIAGNOSIS — L03116 Cellulitis of left lower limb: Secondary | ICD-10-CM | POA: Diagnosis not present

## 2022-10-27 DIAGNOSIS — M1612 Unilateral primary osteoarthritis, left hip: Secondary | ICD-10-CM | POA: Diagnosis not present

## 2022-10-27 DIAGNOSIS — L03115 Cellulitis of right lower limb: Secondary | ICD-10-CM | POA: Diagnosis not present

## 2022-10-27 DIAGNOSIS — R238 Other skin changes: Secondary | ICD-10-CM | POA: Diagnosis not present

## 2022-10-27 DIAGNOSIS — K5790 Diverticulosis of intestine, part unspecified, without perforation or abscess without bleeding: Secondary | ICD-10-CM | POA: Diagnosis not present

## 2022-10-28 DIAGNOSIS — Z4800 Encounter for change or removal of nonsurgical wound dressing: Secondary | ICD-10-CM | POA: Diagnosis not present

## 2022-10-28 DIAGNOSIS — M1612 Unilateral primary osteoarthritis, left hip: Secondary | ICD-10-CM | POA: Diagnosis not present

## 2022-10-28 DIAGNOSIS — Z6836 Body mass index (BMI) 36.0-36.9, adult: Secondary | ICD-10-CM | POA: Diagnosis not present

## 2022-10-28 DIAGNOSIS — R7301 Impaired fasting glucose: Secondary | ICD-10-CM | POA: Diagnosis not present

## 2022-10-28 DIAGNOSIS — Z9181 History of falling: Secondary | ICD-10-CM | POA: Diagnosis not present

## 2022-10-28 DIAGNOSIS — K5790 Diverticulosis of intestine, part unspecified, without perforation or abscess without bleeding: Secondary | ICD-10-CM | POA: Diagnosis not present

## 2022-10-28 DIAGNOSIS — D696 Thrombocytopenia, unspecified: Secondary | ICD-10-CM | POA: Diagnosis not present

## 2022-10-28 DIAGNOSIS — G4733 Obstructive sleep apnea (adult) (pediatric): Secondary | ICD-10-CM | POA: Diagnosis not present

## 2022-10-28 DIAGNOSIS — R238 Other skin changes: Secondary | ICD-10-CM | POA: Diagnosis not present

## 2022-10-28 DIAGNOSIS — R350 Frequency of micturition: Secondary | ICD-10-CM | POA: Diagnosis not present

## 2022-10-28 DIAGNOSIS — M21371 Foot drop, right foot: Secondary | ICD-10-CM | POA: Diagnosis not present

## 2022-10-28 DIAGNOSIS — L03115 Cellulitis of right lower limb: Secondary | ICD-10-CM | POA: Diagnosis not present

## 2022-10-28 DIAGNOSIS — M170 Bilateral primary osteoarthritis of knee: Secondary | ICD-10-CM | POA: Diagnosis not present

## 2022-10-28 DIAGNOSIS — L03116 Cellulitis of left lower limb: Secondary | ICD-10-CM | POA: Diagnosis not present

## 2022-10-28 DIAGNOSIS — E7800 Pure hypercholesterolemia, unspecified: Secondary | ICD-10-CM | POA: Diagnosis not present

## 2022-10-28 DIAGNOSIS — N401 Enlarged prostate with lower urinary tract symptoms: Secondary | ICD-10-CM | POA: Diagnosis not present

## 2022-10-30 DIAGNOSIS — R238 Other skin changes: Secondary | ICD-10-CM | POA: Diagnosis not present

## 2022-10-30 DIAGNOSIS — L03116 Cellulitis of left lower limb: Secondary | ICD-10-CM | POA: Diagnosis not present

## 2022-10-30 DIAGNOSIS — M170 Bilateral primary osteoarthritis of knee: Secondary | ICD-10-CM | POA: Diagnosis not present

## 2022-10-30 DIAGNOSIS — L03115 Cellulitis of right lower limb: Secondary | ICD-10-CM | POA: Diagnosis not present

## 2022-10-30 DIAGNOSIS — M1612 Unilateral primary osteoarthritis, left hip: Secondary | ICD-10-CM | POA: Diagnosis not present

## 2022-10-30 DIAGNOSIS — K5790 Diverticulosis of intestine, part unspecified, without perforation or abscess without bleeding: Secondary | ICD-10-CM | POA: Diagnosis not present

## 2022-11-01 DIAGNOSIS — R238 Other skin changes: Secondary | ICD-10-CM | POA: Diagnosis not present

## 2022-11-01 DIAGNOSIS — E669 Obesity, unspecified: Secondary | ICD-10-CM | POA: Diagnosis not present

## 2022-11-01 DIAGNOSIS — I10 Essential (primary) hypertension: Secondary | ICD-10-CM | POA: Diagnosis not present

## 2022-11-01 DIAGNOSIS — M1612 Unilateral primary osteoarthritis, left hip: Secondary | ICD-10-CM | POA: Diagnosis not present

## 2022-11-01 DIAGNOSIS — K5790 Diverticulosis of intestine, part unspecified, without perforation or abscess without bleeding: Secondary | ICD-10-CM | POA: Diagnosis not present

## 2022-11-01 DIAGNOSIS — L03115 Cellulitis of right lower limb: Secondary | ICD-10-CM | POA: Diagnosis not present

## 2022-11-01 DIAGNOSIS — G4733 Obstructive sleep apnea (adult) (pediatric): Secondary | ICD-10-CM | POA: Diagnosis not present

## 2022-11-01 DIAGNOSIS — L03116 Cellulitis of left lower limb: Secondary | ICD-10-CM | POA: Diagnosis not present

## 2022-11-01 DIAGNOSIS — M170 Bilateral primary osteoarthritis of knee: Secondary | ICD-10-CM | POA: Diagnosis not present

## 2022-11-02 DIAGNOSIS — L03115 Cellulitis of right lower limb: Secondary | ICD-10-CM | POA: Diagnosis not present

## 2022-11-02 DIAGNOSIS — M1612 Unilateral primary osteoarthritis, left hip: Secondary | ICD-10-CM | POA: Diagnosis not present

## 2022-11-02 DIAGNOSIS — M170 Bilateral primary osteoarthritis of knee: Secondary | ICD-10-CM | POA: Diagnosis not present

## 2022-11-02 DIAGNOSIS — K5790 Diverticulosis of intestine, part unspecified, without perforation or abscess without bleeding: Secondary | ICD-10-CM | POA: Diagnosis not present

## 2022-11-02 DIAGNOSIS — R238 Other skin changes: Secondary | ICD-10-CM | POA: Diagnosis not present

## 2022-11-02 DIAGNOSIS — L03116 Cellulitis of left lower limb: Secondary | ICD-10-CM | POA: Diagnosis not present

## 2022-11-06 DIAGNOSIS — K5790 Diverticulosis of intestine, part unspecified, without perforation or abscess without bleeding: Secondary | ICD-10-CM | POA: Diagnosis not present

## 2022-11-06 DIAGNOSIS — R238 Other skin changes: Secondary | ICD-10-CM | POA: Diagnosis not present

## 2022-11-06 DIAGNOSIS — M1612 Unilateral primary osteoarthritis, left hip: Secondary | ICD-10-CM | POA: Diagnosis not present

## 2022-11-06 DIAGNOSIS — L03116 Cellulitis of left lower limb: Secondary | ICD-10-CM | POA: Diagnosis not present

## 2022-11-06 DIAGNOSIS — M170 Bilateral primary osteoarthritis of knee: Secondary | ICD-10-CM | POA: Diagnosis not present

## 2022-11-06 DIAGNOSIS — L03115 Cellulitis of right lower limb: Secondary | ICD-10-CM | POA: Diagnosis not present

## 2022-11-07 DIAGNOSIS — K5790 Diverticulosis of intestine, part unspecified, without perforation or abscess without bleeding: Secondary | ICD-10-CM | POA: Diagnosis not present

## 2022-11-07 DIAGNOSIS — L03115 Cellulitis of right lower limb: Secondary | ICD-10-CM | POA: Diagnosis not present

## 2022-11-07 DIAGNOSIS — L03116 Cellulitis of left lower limb: Secondary | ICD-10-CM | POA: Diagnosis not present

## 2022-11-07 DIAGNOSIS — R238 Other skin changes: Secondary | ICD-10-CM | POA: Diagnosis not present

## 2022-11-07 DIAGNOSIS — M1612 Unilateral primary osteoarthritis, left hip: Secondary | ICD-10-CM | POA: Diagnosis not present

## 2022-11-07 DIAGNOSIS — M170 Bilateral primary osteoarthritis of knee: Secondary | ICD-10-CM | POA: Diagnosis not present

## 2022-11-08 DIAGNOSIS — R238 Other skin changes: Secondary | ICD-10-CM | POA: Diagnosis not present

## 2022-11-08 DIAGNOSIS — M1612 Unilateral primary osteoarthritis, left hip: Secondary | ICD-10-CM | POA: Diagnosis not present

## 2022-11-08 DIAGNOSIS — M170 Bilateral primary osteoarthritis of knee: Secondary | ICD-10-CM | POA: Diagnosis not present

## 2022-11-08 DIAGNOSIS — L03116 Cellulitis of left lower limb: Secondary | ICD-10-CM | POA: Diagnosis not present

## 2022-11-08 DIAGNOSIS — L03115 Cellulitis of right lower limb: Secondary | ICD-10-CM | POA: Diagnosis not present

## 2022-11-08 DIAGNOSIS — K5790 Diverticulosis of intestine, part unspecified, without perforation or abscess without bleeding: Secondary | ICD-10-CM | POA: Diagnosis not present

## 2022-11-10 DIAGNOSIS — K5790 Diverticulosis of intestine, part unspecified, without perforation or abscess without bleeding: Secondary | ICD-10-CM | POA: Diagnosis not present

## 2022-11-10 DIAGNOSIS — L84 Corns and callosities: Secondary | ICD-10-CM | POA: Diagnosis not present

## 2022-11-10 DIAGNOSIS — L03116 Cellulitis of left lower limb: Secondary | ICD-10-CM | POA: Diagnosis not present

## 2022-11-10 DIAGNOSIS — L603 Nail dystrophy: Secondary | ICD-10-CM | POA: Diagnosis not present

## 2022-11-10 DIAGNOSIS — R238 Other skin changes: Secondary | ICD-10-CM | POA: Diagnosis not present

## 2022-11-10 DIAGNOSIS — L97512 Non-pressure chronic ulcer of other part of right foot with fat layer exposed: Secondary | ICD-10-CM | POA: Diagnosis not present

## 2022-11-10 DIAGNOSIS — I739 Peripheral vascular disease, unspecified: Secondary | ICD-10-CM | POA: Diagnosis not present

## 2022-11-10 DIAGNOSIS — L03115 Cellulitis of right lower limb: Secondary | ICD-10-CM | POA: Diagnosis not present

## 2022-11-10 DIAGNOSIS — M170 Bilateral primary osteoarthritis of knee: Secondary | ICD-10-CM | POA: Diagnosis not present

## 2022-11-10 DIAGNOSIS — M1612 Unilateral primary osteoarthritis, left hip: Secondary | ICD-10-CM | POA: Diagnosis not present

## 2022-11-13 DIAGNOSIS — L03116 Cellulitis of left lower limb: Secondary | ICD-10-CM | POA: Diagnosis not present

## 2022-11-13 DIAGNOSIS — L03115 Cellulitis of right lower limb: Secondary | ICD-10-CM | POA: Diagnosis not present

## 2022-11-13 DIAGNOSIS — M1612 Unilateral primary osteoarthritis, left hip: Secondary | ICD-10-CM | POA: Diagnosis not present

## 2022-11-13 DIAGNOSIS — M170 Bilateral primary osteoarthritis of knee: Secondary | ICD-10-CM | POA: Diagnosis not present

## 2022-11-13 DIAGNOSIS — K5790 Diverticulosis of intestine, part unspecified, without perforation or abscess without bleeding: Secondary | ICD-10-CM | POA: Diagnosis not present

## 2022-11-13 DIAGNOSIS — R238 Other skin changes: Secondary | ICD-10-CM | POA: Diagnosis not present

## 2022-11-15 DIAGNOSIS — M1612 Unilateral primary osteoarthritis, left hip: Secondary | ICD-10-CM | POA: Diagnosis not present

## 2022-11-15 DIAGNOSIS — L03116 Cellulitis of left lower limb: Secondary | ICD-10-CM | POA: Diagnosis not present

## 2022-11-15 DIAGNOSIS — K5790 Diverticulosis of intestine, part unspecified, without perforation or abscess without bleeding: Secondary | ICD-10-CM | POA: Diagnosis not present

## 2022-11-15 DIAGNOSIS — L03115 Cellulitis of right lower limb: Secondary | ICD-10-CM | POA: Diagnosis not present

## 2022-11-15 DIAGNOSIS — R238 Other skin changes: Secondary | ICD-10-CM | POA: Diagnosis not present

## 2022-11-15 DIAGNOSIS — M170 Bilateral primary osteoarthritis of knee: Secondary | ICD-10-CM | POA: Diagnosis not present

## 2022-11-16 DIAGNOSIS — M170 Bilateral primary osteoarthritis of knee: Secondary | ICD-10-CM | POA: Diagnosis not present

## 2022-11-16 DIAGNOSIS — M1612 Unilateral primary osteoarthritis, left hip: Secondary | ICD-10-CM | POA: Diagnosis not present

## 2022-11-16 DIAGNOSIS — R238 Other skin changes: Secondary | ICD-10-CM | POA: Diagnosis not present

## 2022-11-16 DIAGNOSIS — L03116 Cellulitis of left lower limb: Secondary | ICD-10-CM | POA: Diagnosis not present

## 2022-11-16 DIAGNOSIS — L03115 Cellulitis of right lower limb: Secondary | ICD-10-CM | POA: Diagnosis not present

## 2022-11-16 DIAGNOSIS — K5790 Diverticulosis of intestine, part unspecified, without perforation or abscess without bleeding: Secondary | ICD-10-CM | POA: Diagnosis not present

## 2022-11-21 DIAGNOSIS — M170 Bilateral primary osteoarthritis of knee: Secondary | ICD-10-CM | POA: Diagnosis not present

## 2022-11-21 DIAGNOSIS — L03116 Cellulitis of left lower limb: Secondary | ICD-10-CM | POA: Diagnosis not present

## 2022-11-21 DIAGNOSIS — R238 Other skin changes: Secondary | ICD-10-CM | POA: Diagnosis not present

## 2022-11-21 DIAGNOSIS — L03115 Cellulitis of right lower limb: Secondary | ICD-10-CM | POA: Diagnosis not present

## 2022-11-21 DIAGNOSIS — K5790 Diverticulosis of intestine, part unspecified, without perforation or abscess without bleeding: Secondary | ICD-10-CM | POA: Diagnosis not present

## 2022-11-21 DIAGNOSIS — M1612 Unilateral primary osteoarthritis, left hip: Secondary | ICD-10-CM | POA: Diagnosis not present

## 2022-11-22 DIAGNOSIS — M1612 Unilateral primary osteoarthritis, left hip: Secondary | ICD-10-CM | POA: Diagnosis not present

## 2022-11-22 DIAGNOSIS — L03115 Cellulitis of right lower limb: Secondary | ICD-10-CM | POA: Diagnosis not present

## 2022-11-22 DIAGNOSIS — R238 Other skin changes: Secondary | ICD-10-CM | POA: Diagnosis not present

## 2022-11-22 DIAGNOSIS — M170 Bilateral primary osteoarthritis of knee: Secondary | ICD-10-CM | POA: Diagnosis not present

## 2022-11-22 DIAGNOSIS — L03116 Cellulitis of left lower limb: Secondary | ICD-10-CM | POA: Diagnosis not present

## 2022-11-22 DIAGNOSIS — K5790 Diverticulosis of intestine, part unspecified, without perforation or abscess without bleeding: Secondary | ICD-10-CM | POA: Diagnosis not present

## 2022-11-24 DIAGNOSIS — M170 Bilateral primary osteoarthritis of knee: Secondary | ICD-10-CM | POA: Diagnosis not present

## 2022-11-24 DIAGNOSIS — L03116 Cellulitis of left lower limb: Secondary | ICD-10-CM | POA: Diagnosis not present

## 2022-11-24 DIAGNOSIS — M1612 Unilateral primary osteoarthritis, left hip: Secondary | ICD-10-CM | POA: Diagnosis not present

## 2022-11-24 DIAGNOSIS — K5790 Diverticulosis of intestine, part unspecified, without perforation or abscess without bleeding: Secondary | ICD-10-CM | POA: Diagnosis not present

## 2022-11-24 DIAGNOSIS — R238 Other skin changes: Secondary | ICD-10-CM | POA: Diagnosis not present

## 2022-11-24 DIAGNOSIS — L03115 Cellulitis of right lower limb: Secondary | ICD-10-CM | POA: Diagnosis not present

## 2022-11-27 DIAGNOSIS — L03116 Cellulitis of left lower limb: Secondary | ICD-10-CM | POA: Diagnosis not present

## 2022-11-27 DIAGNOSIS — M170 Bilateral primary osteoarthritis of knee: Secondary | ICD-10-CM | POA: Diagnosis not present

## 2022-11-27 DIAGNOSIS — D696 Thrombocytopenia, unspecified: Secondary | ICD-10-CM | POA: Diagnosis not present

## 2022-11-27 DIAGNOSIS — R7301 Impaired fasting glucose: Secondary | ICD-10-CM | POA: Diagnosis not present

## 2022-11-27 DIAGNOSIS — R350 Frequency of micturition: Secondary | ICD-10-CM | POA: Diagnosis not present

## 2022-11-27 DIAGNOSIS — M1612 Unilateral primary osteoarthritis, left hip: Secondary | ICD-10-CM | POA: Diagnosis not present

## 2022-11-27 DIAGNOSIS — Z9181 History of falling: Secondary | ICD-10-CM | POA: Diagnosis not present

## 2022-11-27 DIAGNOSIS — E7800 Pure hypercholesterolemia, unspecified: Secondary | ICD-10-CM | POA: Diagnosis not present

## 2022-11-27 DIAGNOSIS — N401 Enlarged prostate with lower urinary tract symptoms: Secondary | ICD-10-CM | POA: Diagnosis not present

## 2022-11-27 DIAGNOSIS — G4733 Obstructive sleep apnea (adult) (pediatric): Secondary | ICD-10-CM | POA: Diagnosis not present

## 2022-11-27 DIAGNOSIS — Z6836 Body mass index (BMI) 36.0-36.9, adult: Secondary | ICD-10-CM | POA: Diagnosis not present

## 2022-11-27 DIAGNOSIS — K5790 Diverticulosis of intestine, part unspecified, without perforation or abscess without bleeding: Secondary | ICD-10-CM | POA: Diagnosis not present

## 2022-11-27 DIAGNOSIS — M21371 Foot drop, right foot: Secondary | ICD-10-CM | POA: Diagnosis not present

## 2022-11-27 DIAGNOSIS — L03115 Cellulitis of right lower limb: Secondary | ICD-10-CM | POA: Diagnosis not present

## 2022-11-27 DIAGNOSIS — R238 Other skin changes: Secondary | ICD-10-CM | POA: Diagnosis not present

## 2022-11-27 DIAGNOSIS — Z4800 Encounter for change or removal of nonsurgical wound dressing: Secondary | ICD-10-CM | POA: Diagnosis not present

## 2022-11-28 DIAGNOSIS — M170 Bilateral primary osteoarthritis of knee: Secondary | ICD-10-CM | POA: Diagnosis not present

## 2022-11-28 DIAGNOSIS — L03116 Cellulitis of left lower limb: Secondary | ICD-10-CM | POA: Diagnosis not present

## 2022-11-28 DIAGNOSIS — L03115 Cellulitis of right lower limb: Secondary | ICD-10-CM | POA: Diagnosis not present

## 2022-11-28 DIAGNOSIS — M1612 Unilateral primary osteoarthritis, left hip: Secondary | ICD-10-CM | POA: Diagnosis not present

## 2022-11-28 DIAGNOSIS — K5790 Diverticulosis of intestine, part unspecified, without perforation or abscess without bleeding: Secondary | ICD-10-CM | POA: Diagnosis not present

## 2022-11-28 DIAGNOSIS — R238 Other skin changes: Secondary | ICD-10-CM | POA: Diagnosis not present

## 2022-11-29 DIAGNOSIS — M170 Bilateral primary osteoarthritis of knee: Secondary | ICD-10-CM | POA: Diagnosis not present

## 2022-11-29 DIAGNOSIS — L03116 Cellulitis of left lower limb: Secondary | ICD-10-CM | POA: Diagnosis not present

## 2022-11-29 DIAGNOSIS — R238 Other skin changes: Secondary | ICD-10-CM | POA: Diagnosis not present

## 2022-11-29 DIAGNOSIS — M1612 Unilateral primary osteoarthritis, left hip: Secondary | ICD-10-CM | POA: Diagnosis not present

## 2022-11-29 DIAGNOSIS — L03115 Cellulitis of right lower limb: Secondary | ICD-10-CM | POA: Diagnosis not present

## 2022-11-29 DIAGNOSIS — K5790 Diverticulosis of intestine, part unspecified, without perforation or abscess without bleeding: Secondary | ICD-10-CM | POA: Diagnosis not present

## 2022-12-01 DIAGNOSIS — M1612 Unilateral primary osteoarthritis, left hip: Secondary | ICD-10-CM | POA: Diagnosis not present

## 2022-12-01 DIAGNOSIS — M170 Bilateral primary osteoarthritis of knee: Secondary | ICD-10-CM | POA: Diagnosis not present

## 2022-12-01 DIAGNOSIS — L03116 Cellulitis of left lower limb: Secondary | ICD-10-CM | POA: Diagnosis not present

## 2022-12-01 DIAGNOSIS — K5790 Diverticulosis of intestine, part unspecified, without perforation or abscess without bleeding: Secondary | ICD-10-CM | POA: Diagnosis not present

## 2022-12-01 DIAGNOSIS — L03115 Cellulitis of right lower limb: Secondary | ICD-10-CM | POA: Diagnosis not present

## 2022-12-01 DIAGNOSIS — R238 Other skin changes: Secondary | ICD-10-CM | POA: Diagnosis not present

## 2022-12-04 DIAGNOSIS — L03115 Cellulitis of right lower limb: Secondary | ICD-10-CM | POA: Diagnosis not present

## 2022-12-04 DIAGNOSIS — M170 Bilateral primary osteoarthritis of knee: Secondary | ICD-10-CM | POA: Diagnosis not present

## 2022-12-04 DIAGNOSIS — K5790 Diverticulosis of intestine, part unspecified, without perforation or abscess without bleeding: Secondary | ICD-10-CM | POA: Diagnosis not present

## 2022-12-04 DIAGNOSIS — L03116 Cellulitis of left lower limb: Secondary | ICD-10-CM | POA: Diagnosis not present

## 2022-12-04 DIAGNOSIS — R238 Other skin changes: Secondary | ICD-10-CM | POA: Diagnosis not present

## 2022-12-04 DIAGNOSIS — M1612 Unilateral primary osteoarthritis, left hip: Secondary | ICD-10-CM | POA: Diagnosis not present

## 2022-12-05 DIAGNOSIS — K5790 Diverticulosis of intestine, part unspecified, without perforation or abscess without bleeding: Secondary | ICD-10-CM | POA: Diagnosis not present

## 2022-12-05 DIAGNOSIS — M170 Bilateral primary osteoarthritis of knee: Secondary | ICD-10-CM | POA: Diagnosis not present

## 2022-12-05 DIAGNOSIS — L03115 Cellulitis of right lower limb: Secondary | ICD-10-CM | POA: Diagnosis not present

## 2022-12-05 DIAGNOSIS — R238 Other skin changes: Secondary | ICD-10-CM | POA: Diagnosis not present

## 2022-12-05 DIAGNOSIS — M1612 Unilateral primary osteoarthritis, left hip: Secondary | ICD-10-CM | POA: Diagnosis not present

## 2022-12-05 DIAGNOSIS — L03116 Cellulitis of left lower limb: Secondary | ICD-10-CM | POA: Diagnosis not present

## 2022-12-07 DIAGNOSIS — R238 Other skin changes: Secondary | ICD-10-CM | POA: Diagnosis not present

## 2022-12-07 DIAGNOSIS — L03116 Cellulitis of left lower limb: Secondary | ICD-10-CM | POA: Diagnosis not present

## 2022-12-07 DIAGNOSIS — M1612 Unilateral primary osteoarthritis, left hip: Secondary | ICD-10-CM | POA: Diagnosis not present

## 2022-12-07 DIAGNOSIS — L03115 Cellulitis of right lower limb: Secondary | ICD-10-CM | POA: Diagnosis not present

## 2022-12-07 DIAGNOSIS — K5790 Diverticulosis of intestine, part unspecified, without perforation or abscess without bleeding: Secondary | ICD-10-CM | POA: Diagnosis not present

## 2022-12-07 DIAGNOSIS — M170 Bilateral primary osteoarthritis of knee: Secondary | ICD-10-CM | POA: Diagnosis not present

## 2022-12-08 DIAGNOSIS — K5790 Diverticulosis of intestine, part unspecified, without perforation or abscess without bleeding: Secondary | ICD-10-CM | POA: Diagnosis not present

## 2022-12-08 DIAGNOSIS — L03115 Cellulitis of right lower limb: Secondary | ICD-10-CM | POA: Diagnosis not present

## 2022-12-08 DIAGNOSIS — M1612 Unilateral primary osteoarthritis, left hip: Secondary | ICD-10-CM | POA: Diagnosis not present

## 2022-12-08 DIAGNOSIS — L03116 Cellulitis of left lower limb: Secondary | ICD-10-CM | POA: Diagnosis not present

## 2022-12-08 DIAGNOSIS — R238 Other skin changes: Secondary | ICD-10-CM | POA: Diagnosis not present

## 2022-12-08 DIAGNOSIS — M170 Bilateral primary osteoarthritis of knee: Secondary | ICD-10-CM | POA: Diagnosis not present

## 2022-12-11 DIAGNOSIS — L03115 Cellulitis of right lower limb: Secondary | ICD-10-CM | POA: Diagnosis not present

## 2022-12-11 DIAGNOSIS — M1612 Unilateral primary osteoarthritis, left hip: Secondary | ICD-10-CM | POA: Diagnosis not present

## 2022-12-11 DIAGNOSIS — M170 Bilateral primary osteoarthritis of knee: Secondary | ICD-10-CM | POA: Diagnosis not present

## 2022-12-11 DIAGNOSIS — K5790 Diverticulosis of intestine, part unspecified, without perforation or abscess without bleeding: Secondary | ICD-10-CM | POA: Diagnosis not present

## 2022-12-11 DIAGNOSIS — R238 Other skin changes: Secondary | ICD-10-CM | POA: Diagnosis not present

## 2022-12-11 DIAGNOSIS — L03116 Cellulitis of left lower limb: Secondary | ICD-10-CM | POA: Diagnosis not present

## 2022-12-14 DIAGNOSIS — K5790 Diverticulosis of intestine, part unspecified, without perforation or abscess without bleeding: Secondary | ICD-10-CM | POA: Diagnosis not present

## 2022-12-14 DIAGNOSIS — M1612 Unilateral primary osteoarthritis, left hip: Secondary | ICD-10-CM | POA: Diagnosis not present

## 2022-12-14 DIAGNOSIS — R238 Other skin changes: Secondary | ICD-10-CM | POA: Diagnosis not present

## 2022-12-14 DIAGNOSIS — L03116 Cellulitis of left lower limb: Secondary | ICD-10-CM | POA: Diagnosis not present

## 2022-12-14 DIAGNOSIS — M170 Bilateral primary osteoarthritis of knee: Secondary | ICD-10-CM | POA: Diagnosis not present

## 2022-12-14 DIAGNOSIS — L03115 Cellulitis of right lower limb: Secondary | ICD-10-CM | POA: Diagnosis not present

## 2022-12-18 DIAGNOSIS — L03115 Cellulitis of right lower limb: Secondary | ICD-10-CM | POA: Diagnosis not present

## 2022-12-18 DIAGNOSIS — M170 Bilateral primary osteoarthritis of knee: Secondary | ICD-10-CM | POA: Diagnosis not present

## 2022-12-18 DIAGNOSIS — L03116 Cellulitis of left lower limb: Secondary | ICD-10-CM | POA: Diagnosis not present

## 2022-12-18 DIAGNOSIS — M1612 Unilateral primary osteoarthritis, left hip: Secondary | ICD-10-CM | POA: Diagnosis not present

## 2022-12-18 DIAGNOSIS — R238 Other skin changes: Secondary | ICD-10-CM | POA: Diagnosis not present

## 2022-12-18 DIAGNOSIS — K5790 Diverticulosis of intestine, part unspecified, without perforation or abscess without bleeding: Secondary | ICD-10-CM | POA: Diagnosis not present

## 2022-12-20 DIAGNOSIS — K5790 Diverticulosis of intestine, part unspecified, without perforation or abscess without bleeding: Secondary | ICD-10-CM | POA: Diagnosis not present

## 2022-12-20 DIAGNOSIS — M170 Bilateral primary osteoarthritis of knee: Secondary | ICD-10-CM | POA: Diagnosis not present

## 2022-12-20 DIAGNOSIS — L03116 Cellulitis of left lower limb: Secondary | ICD-10-CM | POA: Diagnosis not present

## 2022-12-20 DIAGNOSIS — M1612 Unilateral primary osteoarthritis, left hip: Secondary | ICD-10-CM | POA: Diagnosis not present

## 2022-12-20 DIAGNOSIS — R238 Other skin changes: Secondary | ICD-10-CM | POA: Diagnosis not present

## 2022-12-20 DIAGNOSIS — L03115 Cellulitis of right lower limb: Secondary | ICD-10-CM | POA: Diagnosis not present

## 2022-12-21 DIAGNOSIS — M170 Bilateral primary osteoarthritis of knee: Secondary | ICD-10-CM | POA: Diagnosis not present

## 2022-12-21 DIAGNOSIS — L03116 Cellulitis of left lower limb: Secondary | ICD-10-CM | POA: Diagnosis not present

## 2022-12-21 DIAGNOSIS — M1612 Unilateral primary osteoarthritis, left hip: Secondary | ICD-10-CM | POA: Diagnosis not present

## 2022-12-21 DIAGNOSIS — L03115 Cellulitis of right lower limb: Secondary | ICD-10-CM | POA: Diagnosis not present

## 2022-12-21 DIAGNOSIS — K5790 Diverticulosis of intestine, part unspecified, without perforation or abscess without bleeding: Secondary | ICD-10-CM | POA: Diagnosis not present

## 2022-12-21 DIAGNOSIS — R238 Other skin changes: Secondary | ICD-10-CM | POA: Diagnosis not present

## 2022-12-26 DIAGNOSIS — R238 Other skin changes: Secondary | ICD-10-CM | POA: Diagnosis not present

## 2022-12-26 DIAGNOSIS — L03115 Cellulitis of right lower limb: Secondary | ICD-10-CM | POA: Diagnosis not present

## 2022-12-26 DIAGNOSIS — M1612 Unilateral primary osteoarthritis, left hip: Secondary | ICD-10-CM | POA: Diagnosis not present

## 2022-12-26 DIAGNOSIS — L03116 Cellulitis of left lower limb: Secondary | ICD-10-CM | POA: Diagnosis not present

## 2022-12-26 DIAGNOSIS — K5790 Diverticulosis of intestine, part unspecified, without perforation or abscess without bleeding: Secondary | ICD-10-CM | POA: Diagnosis not present

## 2022-12-26 DIAGNOSIS — M170 Bilateral primary osteoarthritis of knee: Secondary | ICD-10-CM | POA: Diagnosis not present

## 2022-12-27 DIAGNOSIS — E7800 Pure hypercholesterolemia, unspecified: Secondary | ICD-10-CM | POA: Diagnosis not present

## 2022-12-27 DIAGNOSIS — R350 Frequency of micturition: Secondary | ICD-10-CM | POA: Diagnosis not present

## 2022-12-27 DIAGNOSIS — G4733 Obstructive sleep apnea (adult) (pediatric): Secondary | ICD-10-CM | POA: Diagnosis not present

## 2022-12-27 DIAGNOSIS — Z9181 History of falling: Secondary | ICD-10-CM | POA: Diagnosis not present

## 2022-12-27 DIAGNOSIS — D696 Thrombocytopenia, unspecified: Secondary | ICD-10-CM | POA: Diagnosis not present

## 2022-12-27 DIAGNOSIS — L03116 Cellulitis of left lower limb: Secondary | ICD-10-CM | POA: Diagnosis not present

## 2022-12-27 DIAGNOSIS — R238 Other skin changes: Secondary | ICD-10-CM | POA: Diagnosis not present

## 2022-12-27 DIAGNOSIS — N401 Enlarged prostate with lower urinary tract symptoms: Secondary | ICD-10-CM | POA: Diagnosis not present

## 2022-12-27 DIAGNOSIS — M170 Bilateral primary osteoarthritis of knee: Secondary | ICD-10-CM | POA: Diagnosis not present

## 2022-12-27 DIAGNOSIS — Z6836 Body mass index (BMI) 36.0-36.9, adult: Secondary | ICD-10-CM | POA: Diagnosis not present

## 2022-12-27 DIAGNOSIS — L03115 Cellulitis of right lower limb: Secondary | ICD-10-CM | POA: Diagnosis not present

## 2022-12-27 DIAGNOSIS — R7301 Impaired fasting glucose: Secondary | ICD-10-CM | POA: Diagnosis not present

## 2022-12-27 DIAGNOSIS — Z4800 Encounter for change or removal of nonsurgical wound dressing: Secondary | ICD-10-CM | POA: Diagnosis not present

## 2022-12-27 DIAGNOSIS — M1612 Unilateral primary osteoarthritis, left hip: Secondary | ICD-10-CM | POA: Diagnosis not present

## 2022-12-27 DIAGNOSIS — M21371 Foot drop, right foot: Secondary | ICD-10-CM | POA: Diagnosis not present

## 2022-12-27 DIAGNOSIS — K5790 Diverticulosis of intestine, part unspecified, without perforation or abscess without bleeding: Secondary | ICD-10-CM | POA: Diagnosis not present

## 2022-12-28 DIAGNOSIS — K5790 Diverticulosis of intestine, part unspecified, without perforation or abscess without bleeding: Secondary | ICD-10-CM | POA: Diagnosis not present

## 2022-12-28 DIAGNOSIS — M170 Bilateral primary osteoarthritis of knee: Secondary | ICD-10-CM | POA: Diagnosis not present

## 2022-12-28 DIAGNOSIS — L03116 Cellulitis of left lower limb: Secondary | ICD-10-CM | POA: Diagnosis not present

## 2022-12-28 DIAGNOSIS — M1612 Unilateral primary osteoarthritis, left hip: Secondary | ICD-10-CM | POA: Diagnosis not present

## 2022-12-28 DIAGNOSIS — R238 Other skin changes: Secondary | ICD-10-CM | POA: Diagnosis not present

## 2022-12-28 DIAGNOSIS — L03115 Cellulitis of right lower limb: Secondary | ICD-10-CM | POA: Diagnosis not present

## 2023-01-01 DIAGNOSIS — K5790 Diverticulosis of intestine, part unspecified, without perforation or abscess without bleeding: Secondary | ICD-10-CM | POA: Diagnosis not present

## 2023-01-01 DIAGNOSIS — M1612 Unilateral primary osteoarthritis, left hip: Secondary | ICD-10-CM | POA: Diagnosis not present

## 2023-01-01 DIAGNOSIS — M170 Bilateral primary osteoarthritis of knee: Secondary | ICD-10-CM | POA: Diagnosis not present

## 2023-01-01 DIAGNOSIS — L03116 Cellulitis of left lower limb: Secondary | ICD-10-CM | POA: Diagnosis not present

## 2023-01-01 DIAGNOSIS — R238 Other skin changes: Secondary | ICD-10-CM | POA: Diagnosis not present

## 2023-01-01 DIAGNOSIS — L03115 Cellulitis of right lower limb: Secondary | ICD-10-CM | POA: Diagnosis not present

## 2023-01-02 DIAGNOSIS — L03116 Cellulitis of left lower limb: Secondary | ICD-10-CM | POA: Diagnosis not present

## 2023-01-02 DIAGNOSIS — M170 Bilateral primary osteoarthritis of knee: Secondary | ICD-10-CM | POA: Diagnosis not present

## 2023-01-02 DIAGNOSIS — K5790 Diverticulosis of intestine, part unspecified, without perforation or abscess without bleeding: Secondary | ICD-10-CM | POA: Diagnosis not present

## 2023-01-02 DIAGNOSIS — M1612 Unilateral primary osteoarthritis, left hip: Secondary | ICD-10-CM | POA: Diagnosis not present

## 2023-01-02 DIAGNOSIS — L03115 Cellulitis of right lower limb: Secondary | ICD-10-CM | POA: Diagnosis not present

## 2023-01-02 DIAGNOSIS — R238 Other skin changes: Secondary | ICD-10-CM | POA: Diagnosis not present

## 2023-01-03 DIAGNOSIS — M170 Bilateral primary osteoarthritis of knee: Secondary | ICD-10-CM | POA: Diagnosis not present

## 2023-01-03 DIAGNOSIS — L03115 Cellulitis of right lower limb: Secondary | ICD-10-CM | POA: Diagnosis not present

## 2023-01-03 DIAGNOSIS — K5790 Diverticulosis of intestine, part unspecified, without perforation or abscess without bleeding: Secondary | ICD-10-CM | POA: Diagnosis not present

## 2023-01-03 DIAGNOSIS — R238 Other skin changes: Secondary | ICD-10-CM | POA: Diagnosis not present

## 2023-01-03 DIAGNOSIS — L03116 Cellulitis of left lower limb: Secondary | ICD-10-CM | POA: Diagnosis not present

## 2023-01-03 DIAGNOSIS — M1612 Unilateral primary osteoarthritis, left hip: Secondary | ICD-10-CM | POA: Diagnosis not present

## 2023-01-04 DIAGNOSIS — E7800 Pure hypercholesterolemia, unspecified: Secondary | ICD-10-CM | POA: Diagnosis not present

## 2023-01-04 DIAGNOSIS — N401 Enlarged prostate with lower urinary tract symptoms: Secondary | ICD-10-CM | POA: Diagnosis not present

## 2023-01-04 DIAGNOSIS — Z0001 Encounter for general adult medical examination with abnormal findings: Secondary | ICD-10-CM | POA: Diagnosis not present

## 2023-01-04 DIAGNOSIS — M1612 Unilateral primary osteoarthritis, left hip: Secondary | ICD-10-CM | POA: Diagnosis not present

## 2023-01-04 DIAGNOSIS — R7301 Impaired fasting glucose: Secondary | ICD-10-CM | POA: Diagnosis not present

## 2023-01-04 DIAGNOSIS — D696 Thrombocytopenia, unspecified: Secondary | ICD-10-CM | POA: Diagnosis not present

## 2023-01-04 DIAGNOSIS — K5790 Diverticulosis of intestine, part unspecified, without perforation or abscess without bleeding: Secondary | ICD-10-CM | POA: Diagnosis not present

## 2023-01-04 DIAGNOSIS — Z79899 Other long term (current) drug therapy: Secondary | ICD-10-CM | POA: Diagnosis not present

## 2023-01-04 DIAGNOSIS — R238 Other skin changes: Secondary | ICD-10-CM | POA: Diagnosis not present

## 2023-01-04 DIAGNOSIS — L03116 Cellulitis of left lower limb: Secondary | ICD-10-CM | POA: Diagnosis not present

## 2023-01-04 DIAGNOSIS — L03115 Cellulitis of right lower limb: Secondary | ICD-10-CM | POA: Diagnosis not present

## 2023-01-04 DIAGNOSIS — M170 Bilateral primary osteoarthritis of knee: Secondary | ICD-10-CM | POA: Diagnosis not present

## 2023-01-04 DIAGNOSIS — M21371 Foot drop, right foot: Secondary | ICD-10-CM | POA: Diagnosis not present

## 2023-01-09 DIAGNOSIS — R238 Other skin changes: Secondary | ICD-10-CM | POA: Diagnosis not present

## 2023-01-09 DIAGNOSIS — K5790 Diverticulosis of intestine, part unspecified, without perforation or abscess without bleeding: Secondary | ICD-10-CM | POA: Diagnosis not present

## 2023-01-09 DIAGNOSIS — M170 Bilateral primary osteoarthritis of knee: Secondary | ICD-10-CM | POA: Diagnosis not present

## 2023-01-09 DIAGNOSIS — L03115 Cellulitis of right lower limb: Secondary | ICD-10-CM | POA: Diagnosis not present

## 2023-01-09 DIAGNOSIS — L03116 Cellulitis of left lower limb: Secondary | ICD-10-CM | POA: Diagnosis not present

## 2023-01-09 DIAGNOSIS — M1612 Unilateral primary osteoarthritis, left hip: Secondary | ICD-10-CM | POA: Diagnosis not present

## 2023-01-12 DIAGNOSIS — M1612 Unilateral primary osteoarthritis, left hip: Secondary | ICD-10-CM | POA: Diagnosis not present

## 2023-01-12 DIAGNOSIS — R238 Other skin changes: Secondary | ICD-10-CM | POA: Diagnosis not present

## 2023-01-12 DIAGNOSIS — L03115 Cellulitis of right lower limb: Secondary | ICD-10-CM | POA: Diagnosis not present

## 2023-01-12 DIAGNOSIS — M170 Bilateral primary osteoarthritis of knee: Secondary | ICD-10-CM | POA: Diagnosis not present

## 2023-01-12 DIAGNOSIS — K5790 Diverticulosis of intestine, part unspecified, without perforation or abscess without bleeding: Secondary | ICD-10-CM | POA: Diagnosis not present

## 2023-01-12 DIAGNOSIS — L03116 Cellulitis of left lower limb: Secondary | ICD-10-CM | POA: Diagnosis not present

## 2023-01-15 DIAGNOSIS — M170 Bilateral primary osteoarthritis of knee: Secondary | ICD-10-CM | POA: Diagnosis not present

## 2023-01-15 DIAGNOSIS — K5790 Diverticulosis of intestine, part unspecified, without perforation or abscess without bleeding: Secondary | ICD-10-CM | POA: Diagnosis not present

## 2023-01-15 DIAGNOSIS — R238 Other skin changes: Secondary | ICD-10-CM | POA: Diagnosis not present

## 2023-01-15 DIAGNOSIS — L03115 Cellulitis of right lower limb: Secondary | ICD-10-CM | POA: Diagnosis not present

## 2023-01-15 DIAGNOSIS — L03116 Cellulitis of left lower limb: Secondary | ICD-10-CM | POA: Diagnosis not present

## 2023-01-15 DIAGNOSIS — M1612 Unilateral primary osteoarthritis, left hip: Secondary | ICD-10-CM | POA: Diagnosis not present

## 2023-01-16 DIAGNOSIS — K5790 Diverticulosis of intestine, part unspecified, without perforation or abscess without bleeding: Secondary | ICD-10-CM | POA: Diagnosis not present

## 2023-01-16 DIAGNOSIS — L03116 Cellulitis of left lower limb: Secondary | ICD-10-CM | POA: Diagnosis not present

## 2023-01-16 DIAGNOSIS — L03115 Cellulitis of right lower limb: Secondary | ICD-10-CM | POA: Diagnosis not present

## 2023-01-16 DIAGNOSIS — R238 Other skin changes: Secondary | ICD-10-CM | POA: Diagnosis not present

## 2023-01-16 DIAGNOSIS — M170 Bilateral primary osteoarthritis of knee: Secondary | ICD-10-CM | POA: Diagnosis not present

## 2023-01-16 DIAGNOSIS — M1612 Unilateral primary osteoarthritis, left hip: Secondary | ICD-10-CM | POA: Diagnosis not present

## 2023-01-17 DIAGNOSIS — L03116 Cellulitis of left lower limb: Secondary | ICD-10-CM | POA: Diagnosis not present

## 2023-01-17 DIAGNOSIS — R238 Other skin changes: Secondary | ICD-10-CM | POA: Diagnosis not present

## 2023-01-17 DIAGNOSIS — M170 Bilateral primary osteoarthritis of knee: Secondary | ICD-10-CM | POA: Diagnosis not present

## 2023-01-17 DIAGNOSIS — L03115 Cellulitis of right lower limb: Secondary | ICD-10-CM | POA: Diagnosis not present

## 2023-01-17 DIAGNOSIS — M1612 Unilateral primary osteoarthritis, left hip: Secondary | ICD-10-CM | POA: Diagnosis not present

## 2023-01-17 DIAGNOSIS — K5790 Diverticulosis of intestine, part unspecified, without perforation or abscess without bleeding: Secondary | ICD-10-CM | POA: Diagnosis not present

## 2023-01-18 DIAGNOSIS — M170 Bilateral primary osteoarthritis of knee: Secondary | ICD-10-CM | POA: Diagnosis not present

## 2023-01-18 DIAGNOSIS — M1612 Unilateral primary osteoarthritis, left hip: Secondary | ICD-10-CM | POA: Diagnosis not present

## 2023-01-18 DIAGNOSIS — R238 Other skin changes: Secondary | ICD-10-CM | POA: Diagnosis not present

## 2023-01-18 DIAGNOSIS — L03115 Cellulitis of right lower limb: Secondary | ICD-10-CM | POA: Diagnosis not present

## 2023-01-18 DIAGNOSIS — K5790 Diverticulosis of intestine, part unspecified, without perforation or abscess without bleeding: Secondary | ICD-10-CM | POA: Diagnosis not present

## 2023-01-18 DIAGNOSIS — L03116 Cellulitis of left lower limb: Secondary | ICD-10-CM | POA: Diagnosis not present

## 2023-01-22 DIAGNOSIS — N401 Enlarged prostate with lower urinary tract symptoms: Secondary | ICD-10-CM | POA: Diagnosis not present

## 2023-01-22 DIAGNOSIS — M1612 Unilateral primary osteoarthritis, left hip: Secondary | ICD-10-CM | POA: Diagnosis not present

## 2023-01-22 DIAGNOSIS — M170 Bilateral primary osteoarthritis of knee: Secondary | ICD-10-CM | POA: Diagnosis not present

## 2023-01-22 DIAGNOSIS — R238 Other skin changes: Secondary | ICD-10-CM | POA: Diagnosis not present

## 2023-01-22 DIAGNOSIS — R3914 Feeling of incomplete bladder emptying: Secondary | ICD-10-CM | POA: Diagnosis not present

## 2023-01-22 DIAGNOSIS — L03115 Cellulitis of right lower limb: Secondary | ICD-10-CM | POA: Diagnosis not present

## 2023-01-22 DIAGNOSIS — L03116 Cellulitis of left lower limb: Secondary | ICD-10-CM | POA: Diagnosis not present

## 2023-01-22 DIAGNOSIS — N3941 Urge incontinence: Secondary | ICD-10-CM | POA: Diagnosis not present

## 2023-01-22 DIAGNOSIS — K5790 Diverticulosis of intestine, part unspecified, without perforation or abscess without bleeding: Secondary | ICD-10-CM | POA: Diagnosis not present

## 2023-01-23 DIAGNOSIS — L03116 Cellulitis of left lower limb: Secondary | ICD-10-CM | POA: Diagnosis not present

## 2023-01-23 DIAGNOSIS — M170 Bilateral primary osteoarthritis of knee: Secondary | ICD-10-CM | POA: Diagnosis not present

## 2023-01-23 DIAGNOSIS — M1612 Unilateral primary osteoarthritis, left hip: Secondary | ICD-10-CM | POA: Diagnosis not present

## 2023-01-23 DIAGNOSIS — K5790 Diverticulosis of intestine, part unspecified, without perforation or abscess without bleeding: Secondary | ICD-10-CM | POA: Diagnosis not present

## 2023-01-23 DIAGNOSIS — L03115 Cellulitis of right lower limb: Secondary | ICD-10-CM | POA: Diagnosis not present

## 2023-01-23 DIAGNOSIS — R238 Other skin changes: Secondary | ICD-10-CM | POA: Diagnosis not present

## 2023-01-24 DIAGNOSIS — M1612 Unilateral primary osteoarthritis, left hip: Secondary | ICD-10-CM | POA: Diagnosis not present

## 2023-01-24 DIAGNOSIS — L03116 Cellulitis of left lower limb: Secondary | ICD-10-CM | POA: Diagnosis not present

## 2023-01-24 DIAGNOSIS — M170 Bilateral primary osteoarthritis of knee: Secondary | ICD-10-CM | POA: Diagnosis not present

## 2023-01-24 DIAGNOSIS — K5790 Diverticulosis of intestine, part unspecified, without perforation or abscess without bleeding: Secondary | ICD-10-CM | POA: Diagnosis not present

## 2023-01-24 DIAGNOSIS — L03115 Cellulitis of right lower limb: Secondary | ICD-10-CM | POA: Diagnosis not present

## 2023-01-24 DIAGNOSIS — R238 Other skin changes: Secondary | ICD-10-CM | POA: Diagnosis not present

## 2023-01-25 DIAGNOSIS — M1612 Unilateral primary osteoarthritis, left hip: Secondary | ICD-10-CM | POA: Diagnosis not present

## 2023-01-25 DIAGNOSIS — R238 Other skin changes: Secondary | ICD-10-CM | POA: Diagnosis not present

## 2023-01-25 DIAGNOSIS — L03115 Cellulitis of right lower limb: Secondary | ICD-10-CM | POA: Diagnosis not present

## 2023-01-25 DIAGNOSIS — K5790 Diverticulosis of intestine, part unspecified, without perforation or abscess without bleeding: Secondary | ICD-10-CM | POA: Diagnosis not present

## 2023-01-25 DIAGNOSIS — M170 Bilateral primary osteoarthritis of knee: Secondary | ICD-10-CM | POA: Diagnosis not present

## 2023-01-25 DIAGNOSIS — L03116 Cellulitis of left lower limb: Secondary | ICD-10-CM | POA: Diagnosis not present

## 2023-01-26 DIAGNOSIS — L89890 Pressure ulcer of other site, unstageable: Secondary | ICD-10-CM | POA: Diagnosis not present

## 2023-01-26 DIAGNOSIS — E7800 Pure hypercholesterolemia, unspecified: Secondary | ICD-10-CM | POA: Diagnosis not present

## 2023-01-26 DIAGNOSIS — G4733 Obstructive sleep apnea (adult) (pediatric): Secondary | ICD-10-CM | POA: Diagnosis not present

## 2023-01-26 DIAGNOSIS — K5791 Diverticulosis of intestine, part unspecified, without perforation or abscess with bleeding: Secondary | ICD-10-CM | POA: Diagnosis not present

## 2023-01-26 DIAGNOSIS — Z6836 Body mass index (BMI) 36.0-36.9, adult: Secondary | ICD-10-CM | POA: Diagnosis not present

## 2023-01-26 DIAGNOSIS — L03116 Cellulitis of left lower limb: Secondary | ICD-10-CM | POA: Diagnosis not present

## 2023-01-26 DIAGNOSIS — N401 Enlarged prostate with lower urinary tract symptoms: Secondary | ICD-10-CM | POA: Diagnosis not present

## 2023-01-26 DIAGNOSIS — M170 Bilateral primary osteoarthritis of knee: Secondary | ICD-10-CM | POA: Diagnosis not present

## 2023-01-26 DIAGNOSIS — M21371 Foot drop, right foot: Secondary | ICD-10-CM | POA: Diagnosis not present

## 2023-01-26 DIAGNOSIS — L03115 Cellulitis of right lower limb: Secondary | ICD-10-CM | POA: Diagnosis not present

## 2023-01-26 DIAGNOSIS — K5790 Diverticulosis of intestine, part unspecified, without perforation or abscess without bleeding: Secondary | ICD-10-CM | POA: Diagnosis not present

## 2023-01-26 DIAGNOSIS — L97911 Non-pressure chronic ulcer of unspecified part of right lower leg limited to breakdown of skin: Secondary | ICD-10-CM | POA: Diagnosis not present

## 2023-01-26 DIAGNOSIS — R350 Frequency of micturition: Secondary | ICD-10-CM | POA: Diagnosis not present

## 2023-01-26 DIAGNOSIS — I872 Venous insufficiency (chronic) (peripheral): Secondary | ICD-10-CM | POA: Diagnosis not present

## 2023-01-26 DIAGNOSIS — M1612 Unilateral primary osteoarthritis, left hip: Secondary | ICD-10-CM | POA: Diagnosis not present

## 2023-01-29 DIAGNOSIS — L03116 Cellulitis of left lower limb: Secondary | ICD-10-CM | POA: Diagnosis not present

## 2023-01-29 DIAGNOSIS — H938X3 Other specified disorders of ear, bilateral: Secondary | ICD-10-CM | POA: Diagnosis not present

## 2023-01-29 DIAGNOSIS — L97911 Non-pressure chronic ulcer of unspecified part of right lower leg limited to breakdown of skin: Secondary | ICD-10-CM | POA: Diagnosis not present

## 2023-01-29 DIAGNOSIS — L89890 Pressure ulcer of other site, unstageable: Secondary | ICD-10-CM | POA: Diagnosis not present

## 2023-01-29 DIAGNOSIS — L03115 Cellulitis of right lower limb: Secondary | ICD-10-CM | POA: Diagnosis not present

## 2023-01-29 DIAGNOSIS — I872 Venous insufficiency (chronic) (peripheral): Secondary | ICD-10-CM | POA: Diagnosis not present

## 2023-01-29 DIAGNOSIS — H9193 Unspecified hearing loss, bilateral: Secondary | ICD-10-CM | POA: Diagnosis not present

## 2023-01-30 DIAGNOSIS — I872 Venous insufficiency (chronic) (peripheral): Secondary | ICD-10-CM | POA: Diagnosis not present

## 2023-01-30 DIAGNOSIS — L03116 Cellulitis of left lower limb: Secondary | ICD-10-CM | POA: Diagnosis not present

## 2023-01-30 DIAGNOSIS — L03115 Cellulitis of right lower limb: Secondary | ICD-10-CM | POA: Diagnosis not present

## 2023-01-30 DIAGNOSIS — L89890 Pressure ulcer of other site, unstageable: Secondary | ICD-10-CM | POA: Diagnosis not present

## 2023-01-30 DIAGNOSIS — L97911 Non-pressure chronic ulcer of unspecified part of right lower leg limited to breakdown of skin: Secondary | ICD-10-CM | POA: Diagnosis not present

## 2023-02-01 DIAGNOSIS — I872 Venous insufficiency (chronic) (peripheral): Secondary | ICD-10-CM | POA: Diagnosis not present

## 2023-02-01 DIAGNOSIS — L03115 Cellulitis of right lower limb: Secondary | ICD-10-CM | POA: Diagnosis not present

## 2023-02-01 DIAGNOSIS — L03116 Cellulitis of left lower limb: Secondary | ICD-10-CM | POA: Diagnosis not present

## 2023-02-01 DIAGNOSIS — L97911 Non-pressure chronic ulcer of unspecified part of right lower leg limited to breakdown of skin: Secondary | ICD-10-CM | POA: Diagnosis not present

## 2023-02-01 DIAGNOSIS — L89890 Pressure ulcer of other site, unstageable: Secondary | ICD-10-CM | POA: Diagnosis not present

## 2023-02-05 DIAGNOSIS — L03116 Cellulitis of left lower limb: Secondary | ICD-10-CM | POA: Diagnosis not present

## 2023-02-05 DIAGNOSIS — I872 Venous insufficiency (chronic) (peripheral): Secondary | ICD-10-CM | POA: Diagnosis not present

## 2023-02-05 DIAGNOSIS — L97911 Non-pressure chronic ulcer of unspecified part of right lower leg limited to breakdown of skin: Secondary | ICD-10-CM | POA: Diagnosis not present

## 2023-02-05 DIAGNOSIS — L03115 Cellulitis of right lower limb: Secondary | ICD-10-CM | POA: Diagnosis not present

## 2023-02-05 DIAGNOSIS — L89890 Pressure ulcer of other site, unstageable: Secondary | ICD-10-CM | POA: Diagnosis not present

## 2023-02-06 DIAGNOSIS — L89890 Pressure ulcer of other site, unstageable: Secondary | ICD-10-CM | POA: Diagnosis not present

## 2023-02-06 DIAGNOSIS — L97911 Non-pressure chronic ulcer of unspecified part of right lower leg limited to breakdown of skin: Secondary | ICD-10-CM | POA: Diagnosis not present

## 2023-02-06 DIAGNOSIS — L03116 Cellulitis of left lower limb: Secondary | ICD-10-CM | POA: Diagnosis not present

## 2023-02-06 DIAGNOSIS — I872 Venous insufficiency (chronic) (peripheral): Secondary | ICD-10-CM | POA: Diagnosis not present

## 2023-02-06 DIAGNOSIS — L03115 Cellulitis of right lower limb: Secondary | ICD-10-CM | POA: Diagnosis not present

## 2023-02-07 DIAGNOSIS — L03116 Cellulitis of left lower limb: Secondary | ICD-10-CM | POA: Diagnosis not present

## 2023-02-07 DIAGNOSIS — L97911 Non-pressure chronic ulcer of unspecified part of right lower leg limited to breakdown of skin: Secondary | ICD-10-CM | POA: Diagnosis not present

## 2023-02-07 DIAGNOSIS — I872 Venous insufficiency (chronic) (peripheral): Secondary | ICD-10-CM | POA: Diagnosis not present

## 2023-02-07 DIAGNOSIS — L89890 Pressure ulcer of other site, unstageable: Secondary | ICD-10-CM | POA: Diagnosis not present

## 2023-02-07 DIAGNOSIS — L03115 Cellulitis of right lower limb: Secondary | ICD-10-CM | POA: Diagnosis not present

## 2023-02-09 DIAGNOSIS — I739 Peripheral vascular disease, unspecified: Secondary | ICD-10-CM | POA: Diagnosis not present

## 2023-02-09 DIAGNOSIS — I872 Venous insufficiency (chronic) (peripheral): Secondary | ICD-10-CM | POA: Diagnosis not present

## 2023-02-09 DIAGNOSIS — L03116 Cellulitis of left lower limb: Secondary | ICD-10-CM | POA: Diagnosis not present

## 2023-02-09 DIAGNOSIS — L97911 Non-pressure chronic ulcer of unspecified part of right lower leg limited to breakdown of skin: Secondary | ICD-10-CM | POA: Diagnosis not present

## 2023-02-09 DIAGNOSIS — L03115 Cellulitis of right lower limb: Secondary | ICD-10-CM | POA: Diagnosis not present

## 2023-02-09 DIAGNOSIS — L84 Corns and callosities: Secondary | ICD-10-CM | POA: Diagnosis not present

## 2023-02-09 DIAGNOSIS — L603 Nail dystrophy: Secondary | ICD-10-CM | POA: Diagnosis not present

## 2023-02-09 DIAGNOSIS — L89890 Pressure ulcer of other site, unstageable: Secondary | ICD-10-CM | POA: Diagnosis not present

## 2023-02-09 DIAGNOSIS — L97511 Non-pressure chronic ulcer of other part of right foot limited to breakdown of skin: Secondary | ICD-10-CM | POA: Diagnosis not present

## 2023-02-13 DIAGNOSIS — L89890 Pressure ulcer of other site, unstageable: Secondary | ICD-10-CM | POA: Diagnosis not present

## 2023-02-13 DIAGNOSIS — L03116 Cellulitis of left lower limb: Secondary | ICD-10-CM | POA: Diagnosis not present

## 2023-02-13 DIAGNOSIS — L03115 Cellulitis of right lower limb: Secondary | ICD-10-CM | POA: Diagnosis not present

## 2023-02-13 DIAGNOSIS — L97911 Non-pressure chronic ulcer of unspecified part of right lower leg limited to breakdown of skin: Secondary | ICD-10-CM | POA: Diagnosis not present

## 2023-02-13 DIAGNOSIS — I872 Venous insufficiency (chronic) (peripheral): Secondary | ICD-10-CM | POA: Diagnosis not present

## 2023-02-16 DIAGNOSIS — L97911 Non-pressure chronic ulcer of unspecified part of right lower leg limited to breakdown of skin: Secondary | ICD-10-CM | POA: Diagnosis not present

## 2023-02-16 DIAGNOSIS — L03116 Cellulitis of left lower limb: Secondary | ICD-10-CM | POA: Diagnosis not present

## 2023-02-16 DIAGNOSIS — L89890 Pressure ulcer of other site, unstageable: Secondary | ICD-10-CM | POA: Diagnosis not present

## 2023-02-16 DIAGNOSIS — I872 Venous insufficiency (chronic) (peripheral): Secondary | ICD-10-CM | POA: Diagnosis not present

## 2023-02-16 DIAGNOSIS — L03115 Cellulitis of right lower limb: Secondary | ICD-10-CM | POA: Diagnosis not present

## 2023-02-19 DIAGNOSIS — L97911 Non-pressure chronic ulcer of unspecified part of right lower leg limited to breakdown of skin: Secondary | ICD-10-CM | POA: Diagnosis not present

## 2023-02-19 DIAGNOSIS — L89890 Pressure ulcer of other site, unstageable: Secondary | ICD-10-CM | POA: Diagnosis not present

## 2023-02-19 DIAGNOSIS — L03115 Cellulitis of right lower limb: Secondary | ICD-10-CM | POA: Diagnosis not present

## 2023-02-19 DIAGNOSIS — I872 Venous insufficiency (chronic) (peripheral): Secondary | ICD-10-CM | POA: Diagnosis not present

## 2023-02-19 DIAGNOSIS — L03116 Cellulitis of left lower limb: Secondary | ICD-10-CM | POA: Diagnosis not present

## 2023-02-21 DIAGNOSIS — I872 Venous insufficiency (chronic) (peripheral): Secondary | ICD-10-CM | POA: Diagnosis not present

## 2023-02-21 DIAGNOSIS — L89890 Pressure ulcer of other site, unstageable: Secondary | ICD-10-CM | POA: Diagnosis not present

## 2023-02-21 DIAGNOSIS — L03116 Cellulitis of left lower limb: Secondary | ICD-10-CM | POA: Diagnosis not present

## 2023-02-21 DIAGNOSIS — L97911 Non-pressure chronic ulcer of unspecified part of right lower leg limited to breakdown of skin: Secondary | ICD-10-CM | POA: Diagnosis not present

## 2023-02-21 DIAGNOSIS — L03115 Cellulitis of right lower limb: Secondary | ICD-10-CM | POA: Diagnosis not present

## 2023-02-22 DIAGNOSIS — L97911 Non-pressure chronic ulcer of unspecified part of right lower leg limited to breakdown of skin: Secondary | ICD-10-CM | POA: Diagnosis not present

## 2023-02-22 DIAGNOSIS — L03116 Cellulitis of left lower limb: Secondary | ICD-10-CM | POA: Diagnosis not present

## 2023-02-22 DIAGNOSIS — I872 Venous insufficiency (chronic) (peripheral): Secondary | ICD-10-CM | POA: Diagnosis not present

## 2023-02-22 DIAGNOSIS — L03115 Cellulitis of right lower limb: Secondary | ICD-10-CM | POA: Diagnosis not present

## 2023-02-22 DIAGNOSIS — L89890 Pressure ulcer of other site, unstageable: Secondary | ICD-10-CM | POA: Diagnosis not present

## 2023-02-25 DIAGNOSIS — I872 Venous insufficiency (chronic) (peripheral): Secondary | ICD-10-CM | POA: Diagnosis not present

## 2023-02-25 DIAGNOSIS — L89890 Pressure ulcer of other site, unstageable: Secondary | ICD-10-CM | POA: Diagnosis not present

## 2023-02-25 DIAGNOSIS — L03116 Cellulitis of left lower limb: Secondary | ICD-10-CM | POA: Diagnosis not present

## 2023-02-25 DIAGNOSIS — N401 Enlarged prostate with lower urinary tract symptoms: Secondary | ICD-10-CM | POA: Diagnosis not present

## 2023-02-25 DIAGNOSIS — K5790 Diverticulosis of intestine, part unspecified, without perforation or abscess without bleeding: Secondary | ICD-10-CM | POA: Diagnosis not present

## 2023-02-25 DIAGNOSIS — M170 Bilateral primary osteoarthritis of knee: Secondary | ICD-10-CM | POA: Diagnosis not present

## 2023-02-25 DIAGNOSIS — E7800 Pure hypercholesterolemia, unspecified: Secondary | ICD-10-CM | POA: Diagnosis not present

## 2023-02-25 DIAGNOSIS — M1612 Unilateral primary osteoarthritis, left hip: Secondary | ICD-10-CM | POA: Diagnosis not present

## 2023-02-25 DIAGNOSIS — M21371 Foot drop, right foot: Secondary | ICD-10-CM | POA: Diagnosis not present

## 2023-02-25 DIAGNOSIS — L97911 Non-pressure chronic ulcer of unspecified part of right lower leg limited to breakdown of skin: Secondary | ICD-10-CM | POA: Diagnosis not present

## 2023-02-25 DIAGNOSIS — L03115 Cellulitis of right lower limb: Secondary | ICD-10-CM | POA: Diagnosis not present

## 2023-02-25 DIAGNOSIS — R350 Frequency of micturition: Secondary | ICD-10-CM | POA: Diagnosis not present

## 2023-02-25 DIAGNOSIS — G4733 Obstructive sleep apnea (adult) (pediatric): Secondary | ICD-10-CM | POA: Diagnosis not present

## 2023-02-25 DIAGNOSIS — Z6836 Body mass index (BMI) 36.0-36.9, adult: Secondary | ICD-10-CM | POA: Diagnosis not present

## 2023-02-26 DIAGNOSIS — L89890 Pressure ulcer of other site, unstageable: Secondary | ICD-10-CM | POA: Diagnosis not present

## 2023-02-26 DIAGNOSIS — Z6836 Body mass index (BMI) 36.0-36.9, adult: Secondary | ICD-10-CM | POA: Diagnosis not present

## 2023-02-26 DIAGNOSIS — L03115 Cellulitis of right lower limb: Secondary | ICD-10-CM | POA: Diagnosis not present

## 2023-02-26 DIAGNOSIS — M21371 Foot drop, right foot: Secondary | ICD-10-CM | POA: Diagnosis not present

## 2023-02-26 DIAGNOSIS — L03116 Cellulitis of left lower limb: Secondary | ICD-10-CM | POA: Diagnosis not present

## 2023-02-26 DIAGNOSIS — L97911 Non-pressure chronic ulcer of unspecified part of right lower leg limited to breakdown of skin: Secondary | ICD-10-CM | POA: Diagnosis not present

## 2023-02-26 DIAGNOSIS — I872 Venous insufficiency (chronic) (peripheral): Secondary | ICD-10-CM | POA: Diagnosis not present

## 2023-03-02 DIAGNOSIS — L89890 Pressure ulcer of other site, unstageable: Secondary | ICD-10-CM | POA: Diagnosis not present

## 2023-03-02 DIAGNOSIS — I872 Venous insufficiency (chronic) (peripheral): Secondary | ICD-10-CM | POA: Diagnosis not present

## 2023-03-02 DIAGNOSIS — L97911 Non-pressure chronic ulcer of unspecified part of right lower leg limited to breakdown of skin: Secondary | ICD-10-CM | POA: Diagnosis not present

## 2023-03-02 DIAGNOSIS — L03115 Cellulitis of right lower limb: Secondary | ICD-10-CM | POA: Diagnosis not present

## 2023-03-02 DIAGNOSIS — L03116 Cellulitis of left lower limb: Secondary | ICD-10-CM | POA: Diagnosis not present

## 2023-03-05 DIAGNOSIS — L03116 Cellulitis of left lower limb: Secondary | ICD-10-CM | POA: Diagnosis not present

## 2023-03-05 DIAGNOSIS — L97911 Non-pressure chronic ulcer of unspecified part of right lower leg limited to breakdown of skin: Secondary | ICD-10-CM | POA: Diagnosis not present

## 2023-03-05 DIAGNOSIS — L89890 Pressure ulcer of other site, unstageable: Secondary | ICD-10-CM | POA: Diagnosis not present

## 2023-03-05 DIAGNOSIS — L03115 Cellulitis of right lower limb: Secondary | ICD-10-CM | POA: Diagnosis not present

## 2023-03-05 DIAGNOSIS — I872 Venous insufficiency (chronic) (peripheral): Secondary | ICD-10-CM | POA: Diagnosis not present

## 2023-03-06 DIAGNOSIS — L03115 Cellulitis of right lower limb: Secondary | ICD-10-CM | POA: Diagnosis not present

## 2023-03-06 DIAGNOSIS — L89890 Pressure ulcer of other site, unstageable: Secondary | ICD-10-CM | POA: Diagnosis not present

## 2023-03-06 DIAGNOSIS — L03116 Cellulitis of left lower limb: Secondary | ICD-10-CM | POA: Diagnosis not present

## 2023-03-06 DIAGNOSIS — L97911 Non-pressure chronic ulcer of unspecified part of right lower leg limited to breakdown of skin: Secondary | ICD-10-CM | POA: Diagnosis not present

## 2023-03-06 DIAGNOSIS — I872 Venous insufficiency (chronic) (peripheral): Secondary | ICD-10-CM | POA: Diagnosis not present

## 2023-03-07 DIAGNOSIS — L03116 Cellulitis of left lower limb: Secondary | ICD-10-CM | POA: Diagnosis not present

## 2023-03-07 DIAGNOSIS — L89890 Pressure ulcer of other site, unstageable: Secondary | ICD-10-CM | POA: Diagnosis not present

## 2023-03-07 DIAGNOSIS — L97911 Non-pressure chronic ulcer of unspecified part of right lower leg limited to breakdown of skin: Secondary | ICD-10-CM | POA: Diagnosis not present

## 2023-03-07 DIAGNOSIS — I872 Venous insufficiency (chronic) (peripheral): Secondary | ICD-10-CM | POA: Diagnosis not present

## 2023-03-07 DIAGNOSIS — L03115 Cellulitis of right lower limb: Secondary | ICD-10-CM | POA: Diagnosis not present

## 2023-03-08 DIAGNOSIS — H9042 Sensorineural hearing loss, unilateral, left ear, with unrestricted hearing on the contralateral side: Secondary | ICD-10-CM | POA: Diagnosis not present

## 2023-03-14 DIAGNOSIS — L97911 Non-pressure chronic ulcer of unspecified part of right lower leg limited to breakdown of skin: Secondary | ICD-10-CM | POA: Diagnosis not present

## 2023-03-14 DIAGNOSIS — L03116 Cellulitis of left lower limb: Secondary | ICD-10-CM | POA: Diagnosis not present

## 2023-03-14 DIAGNOSIS — L03115 Cellulitis of right lower limb: Secondary | ICD-10-CM | POA: Diagnosis not present

## 2023-03-14 DIAGNOSIS — I872 Venous insufficiency (chronic) (peripheral): Secondary | ICD-10-CM | POA: Diagnosis not present

## 2023-03-14 DIAGNOSIS — L89890 Pressure ulcer of other site, unstageable: Secondary | ICD-10-CM | POA: Diagnosis not present

## 2023-03-15 ENCOUNTER — Other Ambulatory Visit: Payer: Self-pay | Admitting: Physician Assistant

## 2023-03-15 DIAGNOSIS — L03116 Cellulitis of left lower limb: Secondary | ICD-10-CM | POA: Diagnosis not present

## 2023-03-15 DIAGNOSIS — H903 Sensorineural hearing loss, bilateral: Secondary | ICD-10-CM

## 2023-03-15 DIAGNOSIS — I872 Venous insufficiency (chronic) (peripheral): Secondary | ICD-10-CM | POA: Diagnosis not present

## 2023-03-15 DIAGNOSIS — L97911 Non-pressure chronic ulcer of unspecified part of right lower leg limited to breakdown of skin: Secondary | ICD-10-CM | POA: Diagnosis not present

## 2023-03-15 DIAGNOSIS — L89890 Pressure ulcer of other site, unstageable: Secondary | ICD-10-CM | POA: Diagnosis not present

## 2023-03-15 DIAGNOSIS — L03115 Cellulitis of right lower limb: Secondary | ICD-10-CM | POA: Diagnosis not present

## 2023-03-19 DIAGNOSIS — L03115 Cellulitis of right lower limb: Secondary | ICD-10-CM | POA: Diagnosis not present

## 2023-03-19 DIAGNOSIS — L97911 Non-pressure chronic ulcer of unspecified part of right lower leg limited to breakdown of skin: Secondary | ICD-10-CM | POA: Diagnosis not present

## 2023-03-19 DIAGNOSIS — L89890 Pressure ulcer of other site, unstageable: Secondary | ICD-10-CM | POA: Diagnosis not present

## 2023-03-19 DIAGNOSIS — I872 Venous insufficiency (chronic) (peripheral): Secondary | ICD-10-CM | POA: Diagnosis not present

## 2023-03-19 DIAGNOSIS — L03116 Cellulitis of left lower limb: Secondary | ICD-10-CM | POA: Diagnosis not present

## 2023-03-21 DIAGNOSIS — L03115 Cellulitis of right lower limb: Secondary | ICD-10-CM | POA: Diagnosis not present

## 2023-03-21 DIAGNOSIS — L89890 Pressure ulcer of other site, unstageable: Secondary | ICD-10-CM | POA: Diagnosis not present

## 2023-03-21 DIAGNOSIS — L03116 Cellulitis of left lower limb: Secondary | ICD-10-CM | POA: Diagnosis not present

## 2023-03-21 DIAGNOSIS — L97911 Non-pressure chronic ulcer of unspecified part of right lower leg limited to breakdown of skin: Secondary | ICD-10-CM | POA: Diagnosis not present

## 2023-03-21 DIAGNOSIS — I872 Venous insufficiency (chronic) (peripheral): Secondary | ICD-10-CM | POA: Diagnosis not present

## 2023-03-23 DIAGNOSIS — L89890 Pressure ulcer of other site, unstageable: Secondary | ICD-10-CM | POA: Diagnosis not present

## 2023-03-23 DIAGNOSIS — L97911 Non-pressure chronic ulcer of unspecified part of right lower leg limited to breakdown of skin: Secondary | ICD-10-CM | POA: Diagnosis not present

## 2023-03-23 DIAGNOSIS — I872 Venous insufficiency (chronic) (peripheral): Secondary | ICD-10-CM | POA: Diagnosis not present

## 2023-03-23 DIAGNOSIS — L03116 Cellulitis of left lower limb: Secondary | ICD-10-CM | POA: Diagnosis not present

## 2023-03-23 DIAGNOSIS — L03115 Cellulitis of right lower limb: Secondary | ICD-10-CM | POA: Diagnosis not present

## 2023-03-29 ENCOUNTER — Ambulatory Visit (HOSPITAL_BASED_OUTPATIENT_CLINIC_OR_DEPARTMENT_OTHER): Payer: Medicare Other | Attending: Family Medicine | Admitting: Physical Therapy

## 2023-03-29 ENCOUNTER — Other Ambulatory Visit: Payer: Self-pay

## 2023-03-29 ENCOUNTER — Encounter (HOSPITAL_BASED_OUTPATIENT_CLINIC_OR_DEPARTMENT_OTHER): Payer: Self-pay | Admitting: Physical Therapy

## 2023-03-29 DIAGNOSIS — M6281 Muscle weakness (generalized): Secondary | ICD-10-CM | POA: Insufficient documentation

## 2023-03-29 DIAGNOSIS — R262 Difficulty in walking, not elsewhere classified: Secondary | ICD-10-CM | POA: Diagnosis not present
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
## 2023-04-02 ENCOUNTER — Encounter (HOSPITAL_BASED_OUTPATIENT_CLINIC_OR_DEPARTMENT_OTHER): Payer: Self-pay | Admitting: Physical Therapy

## 2023-04-02 ENCOUNTER — Ambulatory Visit (HOSPITAL_BASED_OUTPATIENT_CLINIC_OR_DEPARTMENT_OTHER): Payer: Medicare Other | Admitting: Physical Therapy

## 2023-04-02 DIAGNOSIS — M6281 Muscle weakness (generalized): Secondary | ICD-10-CM

## 2023-04-02 DIAGNOSIS — R262 Difficulty in walking, not elsewhere classified: Secondary | ICD-10-CM | POA: Diagnosis not present
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
## 2023-04-09 ENCOUNTER — Ambulatory Visit (HOSPITAL_BASED_OUTPATIENT_CLINIC_OR_DEPARTMENT_OTHER): Payer: Medicare Other | Admitting: Physical Therapy

## 2023-04-09 ENCOUNTER — Encounter (HOSPITAL_BASED_OUTPATIENT_CLINIC_OR_DEPARTMENT_OTHER): Payer: Self-pay | Admitting: Physical Therapy

## 2023-04-09 DIAGNOSIS — R262 Difficulty in walking, not elsewhere classified: Secondary | ICD-10-CM | POA: Diagnosis not present

## 2023-04-09 DIAGNOSIS — M6281 Muscle weakness (generalized): Secondary | ICD-10-CM
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
## 2023-04-18 ENCOUNTER — Encounter (HOSPITAL_BASED_OUTPATIENT_CLINIC_OR_DEPARTMENT_OTHER): Payer: Self-pay | Admitting: Physical Therapy

## 2023-04-18 ENCOUNTER — Ambulatory Visit (HOSPITAL_BASED_OUTPATIENT_CLINIC_OR_DEPARTMENT_OTHER): Payer: Medicare Other | Attending: Family Medicine | Admitting: Physical Therapy

## 2023-04-18 DIAGNOSIS — R262 Difficulty in walking, not elsewhere classified: Secondary | ICD-10-CM | POA: Insufficient documentation

## 2023-04-18 DIAGNOSIS — M6281 Muscle weakness (generalized): Secondary | ICD-10-CM | POA: Insufficient documentation
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
## 2023-04-20 ENCOUNTER — Ambulatory Visit (HOSPITAL_BASED_OUTPATIENT_CLINIC_OR_DEPARTMENT_OTHER): Payer: Medicare Other | Admitting: Physical Therapy

## 2023-04-20 ENCOUNTER — Encounter (HOSPITAL_BASED_OUTPATIENT_CLINIC_OR_DEPARTMENT_OTHER): Payer: Self-pay | Admitting: Physical Therapy

## 2023-04-20 DIAGNOSIS — M6281 Muscle weakness (generalized): Secondary | ICD-10-CM

## 2023-04-20 DIAGNOSIS — R262 Difficulty in walking, not elsewhere classified: Secondary | ICD-10-CM | POA: Diagnosis not present
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
## 2023-04-23 ENCOUNTER — Ambulatory Visit (HOSPITAL_BASED_OUTPATIENT_CLINIC_OR_DEPARTMENT_OTHER): Payer: Medicare Other | Admitting: Physical Therapy

## 2023-04-23 ENCOUNTER — Encounter (HOSPITAL_BASED_OUTPATIENT_CLINIC_OR_DEPARTMENT_OTHER): Payer: Self-pay | Admitting: Physical Therapy

## 2023-04-23 DIAGNOSIS — M6281 Muscle weakness (generalized): Secondary | ICD-10-CM

## 2023-04-23 DIAGNOSIS — R262 Difficulty in walking, not elsewhere classified: Secondary | ICD-10-CM
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
## 2023-04-26 ENCOUNTER — Encounter (HOSPITAL_BASED_OUTPATIENT_CLINIC_OR_DEPARTMENT_OTHER): Payer: Self-pay | Admitting: Physical Therapy

## 2023-04-26 ENCOUNTER — Ambulatory Visit (HOSPITAL_BASED_OUTPATIENT_CLINIC_OR_DEPARTMENT_OTHER): Payer: Medicare Other | Admitting: Physical Therapy

## 2023-04-26 DIAGNOSIS — M6281 Muscle weakness (generalized): Secondary | ICD-10-CM | POA: Diagnosis not present

## 2023-04-26 DIAGNOSIS — R262 Difficulty in walking, not elsewhere classified: Secondary | ICD-10-CM | POA: Diagnosis not present
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
## 2023-04-30 ENCOUNTER — Ambulatory Visit
Admission: RE | Admit: 2023-04-30 | Discharge: 2023-04-30 | Disposition: A | Discharge status: Home / Self Care | Payer: Medicare Other | Source: Ambulatory Visit | Attending: Physician Assistant | Admitting: Physician Assistant

## 2023-04-30 DIAGNOSIS — H903 Sensorineural hearing loss, bilateral: Secondary | ICD-10-CM

## 2023-04-30 DIAGNOSIS — H9193 Unspecified hearing loss, bilateral: Secondary | ICD-10-CM | POA: Diagnosis not present

## 2023-04-30 MED ORDER — GADOPICLENOL 0.5 MMOL/ML IV SOLN
10.0000 mL | Freq: Once | INTRAVENOUS | Status: AC | PRN
  Administered 2023-04-30: 10 mL via INTRAVENOUS

## 2023-05-01 ENCOUNTER — Ambulatory Visit (HOSPITAL_BASED_OUTPATIENT_CLINIC_OR_DEPARTMENT_OTHER): Payer: Medicare Other | Admitting: Physical Therapy

## 2023-05-01 ENCOUNTER — Encounter (HOSPITAL_BASED_OUTPATIENT_CLINIC_OR_DEPARTMENT_OTHER): Payer: Self-pay | Admitting: Physical Therapy

## 2023-05-01 DIAGNOSIS — R262 Difficulty in walking, not elsewhere classified: Secondary | ICD-10-CM | POA: Diagnosis not present

## 2023-05-01 DIAGNOSIS — M6281 Muscle weakness (generalized): Secondary | ICD-10-CM
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
## 2023-05-03 ENCOUNTER — Encounter (HOSPITAL_BASED_OUTPATIENT_CLINIC_OR_DEPARTMENT_OTHER): Payer: Self-pay | Admitting: Physical Therapy

## 2023-05-03 ENCOUNTER — Ambulatory Visit (HOSPITAL_BASED_OUTPATIENT_CLINIC_OR_DEPARTMENT_OTHER): Payer: Medicare Other | Admitting: Physical Therapy

## 2023-05-03 DIAGNOSIS — R262 Difficulty in walking, not elsewhere classified: Secondary | ICD-10-CM | POA: Diagnosis not present

## 2023-05-03 DIAGNOSIS — M6281 Muscle weakness (generalized): Secondary | ICD-10-CM
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
## 2023-05-07 ENCOUNTER — Ambulatory Visit (HOSPITAL_BASED_OUTPATIENT_CLINIC_OR_DEPARTMENT_OTHER): Payer: PRIVATE HEALTH INSURANCE | Admitting: Physical Therapy

## 2023-05-10 ENCOUNTER — Ambulatory Visit (HOSPITAL_BASED_OUTPATIENT_CLINIC_OR_DEPARTMENT_OTHER): Payer: Medicare Other | Admitting: Physical Therapy

## 2023-05-10 ENCOUNTER — Encounter (HOSPITAL_BASED_OUTPATIENT_CLINIC_OR_DEPARTMENT_OTHER): Payer: Self-pay

## 2023-05-11 ENCOUNTER — Encounter (HOSPITAL_BASED_OUTPATIENT_CLINIC_OR_DEPARTMENT_OTHER): Payer: Self-pay | Admitting: Physical Therapy

## 2023-05-11 ENCOUNTER — Ambulatory Visit (HOSPITAL_BASED_OUTPATIENT_CLINIC_OR_DEPARTMENT_OTHER): Payer: Medicare Other | Admitting: Physical Therapy

## 2023-05-11 DIAGNOSIS — M6281 Muscle weakness (generalized): Secondary | ICD-10-CM | POA: Diagnosis not present

## 2023-05-11 DIAGNOSIS — R262 Difficulty in walking, not elsewhere classified: Secondary | ICD-10-CM | POA: Diagnosis not present
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
## 2023-05-14 ENCOUNTER — Encounter (HOSPITAL_BASED_OUTPATIENT_CLINIC_OR_DEPARTMENT_OTHER): Payer: Self-pay | Admitting: Physical Therapy

## 2023-05-14 ENCOUNTER — Ambulatory Visit (HOSPITAL_BASED_OUTPATIENT_CLINIC_OR_DEPARTMENT_OTHER): Payer: Medicare Other | Admitting: Physical Therapy

## 2023-05-14 DIAGNOSIS — M6281 Muscle weakness (generalized): Secondary | ICD-10-CM | POA: Diagnosis not present

## 2023-05-14 DIAGNOSIS — R262 Difficulty in walking, not elsewhere classified: Secondary | ICD-10-CM
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
## 2023-05-17 ENCOUNTER — Encounter (HOSPITAL_BASED_OUTPATIENT_CLINIC_OR_DEPARTMENT_OTHER): Payer: Self-pay | Admitting: Physical Therapy

## 2023-05-17 ENCOUNTER — Ambulatory Visit (HOSPITAL_BASED_OUTPATIENT_CLINIC_OR_DEPARTMENT_OTHER): Payer: Medicare Other | Attending: Family Medicine | Admitting: Physical Therapy

## 2023-05-17 DIAGNOSIS — R262 Difficulty in walking, not elsewhere classified: Secondary | ICD-10-CM | POA: Diagnosis not present

## 2023-05-17 DIAGNOSIS — M6281 Muscle weakness (generalized): Secondary | ICD-10-CM | POA: Diagnosis not present
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
## 2023-05-22 ENCOUNTER — Encounter (HOSPITAL_BASED_OUTPATIENT_CLINIC_OR_DEPARTMENT_OTHER): Payer: Self-pay | Admitting: Physical Therapy

## 2023-05-22 ENCOUNTER — Ambulatory Visit (HOSPITAL_BASED_OUTPATIENT_CLINIC_OR_DEPARTMENT_OTHER): Payer: Medicare Other | Admitting: Physical Therapy

## 2023-05-22 DIAGNOSIS — R262 Difficulty in walking, not elsewhere classified: Secondary | ICD-10-CM | POA: Diagnosis not present

## 2023-05-22 DIAGNOSIS — M6281 Muscle weakness (generalized): Secondary | ICD-10-CM
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
## 2023-05-23 DIAGNOSIS — L603 Nail dystrophy: Secondary | ICD-10-CM | POA: Diagnosis not present

## 2023-05-23 DIAGNOSIS — I739 Peripheral vascular disease, unspecified: Secondary | ICD-10-CM | POA: Diagnosis not present

## 2023-05-23 DIAGNOSIS — L84 Corns and callosities: Secondary | ICD-10-CM | POA: Diagnosis not present

## 2023-05-25 ENCOUNTER — Ambulatory Visit (HOSPITAL_BASED_OUTPATIENT_CLINIC_OR_DEPARTMENT_OTHER): Payer: Medicare Other | Admitting: Physical Therapy

## 2023-05-25 ENCOUNTER — Encounter (HOSPITAL_BASED_OUTPATIENT_CLINIC_OR_DEPARTMENT_OTHER): Payer: Self-pay | Admitting: Physical Therapy

## 2023-05-25 DIAGNOSIS — M6281 Muscle weakness (generalized): Secondary | ICD-10-CM

## 2023-05-25 DIAGNOSIS — R262 Difficulty in walking, not elsewhere classified: Secondary | ICD-10-CM | POA: Diagnosis not present
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
## 2023-05-30 ENCOUNTER — Encounter (HOSPITAL_BASED_OUTPATIENT_CLINIC_OR_DEPARTMENT_OTHER): Payer: Self-pay | Admitting: Physical Therapy

## 2023-05-30 ENCOUNTER — Ambulatory Visit (HOSPITAL_BASED_OUTPATIENT_CLINIC_OR_DEPARTMENT_OTHER): Payer: Medicare Other | Admitting: Physical Therapy

## 2023-05-30 DIAGNOSIS — R262 Difficulty in walking, not elsewhere classified: Secondary | ICD-10-CM | POA: Diagnosis not present

## 2023-05-30 DIAGNOSIS — M6281 Muscle weakness (generalized): Secondary | ICD-10-CM
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
## 2023-06-01 ENCOUNTER — Encounter (HOSPITAL_BASED_OUTPATIENT_CLINIC_OR_DEPARTMENT_OTHER): Payer: Self-pay | Admitting: Physical Therapy

## 2023-06-01 ENCOUNTER — Ambulatory Visit (HOSPITAL_BASED_OUTPATIENT_CLINIC_OR_DEPARTMENT_OTHER): Payer: Medicare Other | Admitting: Physical Therapy

## 2023-06-01 DIAGNOSIS — M6281 Muscle weakness (generalized): Secondary | ICD-10-CM

## 2023-06-01 DIAGNOSIS — R262 Difficulty in walking, not elsewhere classified: Secondary | ICD-10-CM
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
## 2023-06-06 ENCOUNTER — Ambulatory Visit (HOSPITAL_BASED_OUTPATIENT_CLINIC_OR_DEPARTMENT_OTHER): Payer: Medicare Other | Admitting: Physical Therapy

## 2023-06-06 ENCOUNTER — Encounter (HOSPITAL_BASED_OUTPATIENT_CLINIC_OR_DEPARTMENT_OTHER): Payer: Self-pay | Admitting: Physical Therapy

## 2023-06-06 DIAGNOSIS — R262 Difficulty in walking, not elsewhere classified: Secondary | ICD-10-CM | POA: Diagnosis not present

## 2023-06-06 DIAGNOSIS — M6281 Muscle weakness (generalized): Secondary | ICD-10-CM | POA: Diagnosis not present
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
## 2023-06-08 ENCOUNTER — Encounter (HOSPITAL_BASED_OUTPATIENT_CLINIC_OR_DEPARTMENT_OTHER): Payer: Self-pay | Admitting: Physical Therapy

## 2023-06-08 ENCOUNTER — Ambulatory Visit (HOSPITAL_BASED_OUTPATIENT_CLINIC_OR_DEPARTMENT_OTHER): Payer: Medicare Other | Admitting: Physical Therapy

## 2023-06-08 DIAGNOSIS — M6281 Muscle weakness (generalized): Secondary | ICD-10-CM

## 2023-06-08 DIAGNOSIS — R262 Difficulty in walking, not elsewhere classified: Secondary | ICD-10-CM
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
## 2023-06-13 ENCOUNTER — Ambulatory Visit (HOSPITAL_BASED_OUTPATIENT_CLINIC_OR_DEPARTMENT_OTHER): Payer: Medicare Other | Admitting: Physical Therapy

## 2023-06-13 ENCOUNTER — Encounter (HOSPITAL_BASED_OUTPATIENT_CLINIC_OR_DEPARTMENT_OTHER): Payer: Self-pay | Admitting: Physical Therapy

## 2023-06-13 DIAGNOSIS — R262 Difficulty in walking, not elsewhere classified: Secondary | ICD-10-CM | POA: Diagnosis not present

## 2023-06-13 DIAGNOSIS — M6281 Muscle weakness (generalized): Secondary | ICD-10-CM
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
## 2023-06-15 ENCOUNTER — Ambulatory Visit (HOSPITAL_BASED_OUTPATIENT_CLINIC_OR_DEPARTMENT_OTHER): Payer: Medicare Other | Admitting: Physical Therapy

## 2023-06-15 ENCOUNTER — Encounter (HOSPITAL_BASED_OUTPATIENT_CLINIC_OR_DEPARTMENT_OTHER): Payer: Self-pay | Admitting: Physical Therapy

## 2023-06-15 DIAGNOSIS — M6281 Muscle weakness (generalized): Secondary | ICD-10-CM | POA: Diagnosis not present

## 2023-06-15 DIAGNOSIS — R262 Difficulty in walking, not elsewhere classified: Secondary | ICD-10-CM
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
## 2023-06-20 ENCOUNTER — Encounter (HOSPITAL_BASED_OUTPATIENT_CLINIC_OR_DEPARTMENT_OTHER): Payer: Self-pay | Admitting: Physical Therapy

## 2023-06-20 ENCOUNTER — Ambulatory Visit (HOSPITAL_BASED_OUTPATIENT_CLINIC_OR_DEPARTMENT_OTHER): Payer: Medicare Other | Attending: Family Medicine | Admitting: Physical Therapy

## 2023-06-20 DIAGNOSIS — M21371 Foot drop, right foot: Secondary | ICD-10-CM | POA: Insufficient documentation

## 2023-06-20 DIAGNOSIS — M170 Bilateral primary osteoarthritis of knee: Secondary | ICD-10-CM | POA: Insufficient documentation

## 2023-06-20 DIAGNOSIS — M6281 Muscle weakness (generalized): Secondary | ICD-10-CM | POA: Diagnosis not present

## 2023-06-20 DIAGNOSIS — R262 Difficulty in walking, not elsewhere classified: Secondary | ICD-10-CM | POA: Diagnosis not present
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
## 2023-06-22 ENCOUNTER — Encounter (HOSPITAL_BASED_OUTPATIENT_CLINIC_OR_DEPARTMENT_OTHER): Payer: Self-pay | Admitting: Physical Therapy

## 2023-06-22 ENCOUNTER — Ambulatory Visit (HOSPITAL_BASED_OUTPATIENT_CLINIC_OR_DEPARTMENT_OTHER): Payer: Medicare Other | Admitting: Physical Therapy

## 2023-06-22 DIAGNOSIS — R262 Difficulty in walking, not elsewhere classified: Secondary | ICD-10-CM | POA: Diagnosis not present

## 2023-06-22 DIAGNOSIS — M6281 Muscle weakness (generalized): Secondary | ICD-10-CM | POA: Diagnosis not present
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
## 2023-06-26 DIAGNOSIS — Z23 Encounter for immunization: Secondary | ICD-10-CM | POA: Diagnosis not present

## 2023-06-27 ENCOUNTER — Encounter (HOSPITAL_BASED_OUTPATIENT_CLINIC_OR_DEPARTMENT_OTHER): Payer: Self-pay | Admitting: Physical Therapy

## 2023-06-27 ENCOUNTER — Ambulatory Visit (HOSPITAL_BASED_OUTPATIENT_CLINIC_OR_DEPARTMENT_OTHER): Payer: Medicare Other | Admitting: Physical Therapy

## 2023-06-27 DIAGNOSIS — M6281 Muscle weakness (generalized): Secondary | ICD-10-CM | POA: Diagnosis not present

## 2023-06-27 DIAGNOSIS — R262 Difficulty in walking, not elsewhere classified: Secondary | ICD-10-CM
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
## 2023-06-29 ENCOUNTER — Ambulatory Visit (HOSPITAL_BASED_OUTPATIENT_CLINIC_OR_DEPARTMENT_OTHER): Payer: Medicare Other | Admitting: Physical Therapy

## 2023-06-29 ENCOUNTER — Encounter (HOSPITAL_BASED_OUTPATIENT_CLINIC_OR_DEPARTMENT_OTHER): Payer: Self-pay | Admitting: Physical Therapy

## 2023-06-29 DIAGNOSIS — M6281 Muscle weakness (generalized): Secondary | ICD-10-CM

## 2023-06-29 DIAGNOSIS — R262 Difficulty in walking, not elsewhere classified: Secondary | ICD-10-CM | POA: Diagnosis not present
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
## 2023-07-03 ENCOUNTER — Ambulatory Visit (HOSPITAL_BASED_OUTPATIENT_CLINIC_OR_DEPARTMENT_OTHER): Payer: PRIVATE HEALTH INSURANCE | Admitting: Physical Therapy

## 2023-07-04 ENCOUNTER — Encounter (HOSPITAL_BASED_OUTPATIENT_CLINIC_OR_DEPARTMENT_OTHER): Payer: Self-pay | Admitting: Physical Therapy

## 2023-07-04 ENCOUNTER — Ambulatory Visit (HOSPITAL_BASED_OUTPATIENT_CLINIC_OR_DEPARTMENT_OTHER): Payer: Medicare Other | Admitting: Physical Therapy

## 2023-07-04 DIAGNOSIS — R262 Difficulty in walking, not elsewhere classified: Secondary | ICD-10-CM

## 2023-07-04 DIAGNOSIS — M6281 Muscle weakness (generalized): Secondary | ICD-10-CM | POA: Diagnosis not present
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
## 2023-07-06 ENCOUNTER — Ambulatory Visit (HOSPITAL_BASED_OUTPATIENT_CLINIC_OR_DEPARTMENT_OTHER): Payer: Medicare Other | Admitting: Physical Therapy

## 2023-07-06 ENCOUNTER — Encounter (HOSPITAL_BASED_OUTPATIENT_CLINIC_OR_DEPARTMENT_OTHER): Payer: Self-pay | Admitting: Physical Therapy

## 2023-07-06 DIAGNOSIS — R262 Difficulty in walking, not elsewhere classified: Secondary | ICD-10-CM

## 2023-07-06 DIAGNOSIS — M6281 Muscle weakness (generalized): Secondary | ICD-10-CM | POA: Diagnosis not present
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
## 2023-07-10 ENCOUNTER — Ambulatory Visit (HOSPITAL_BASED_OUTPATIENT_CLINIC_OR_DEPARTMENT_OTHER): Payer: Medicare Other | Admitting: Physical Therapy

## 2023-07-10 ENCOUNTER — Encounter (HOSPITAL_BASED_OUTPATIENT_CLINIC_OR_DEPARTMENT_OTHER): Payer: Self-pay | Admitting: Physical Therapy

## 2023-07-10 DIAGNOSIS — D696 Thrombocytopenia, unspecified: Secondary | ICD-10-CM | POA: Diagnosis not present

## 2023-07-10 DIAGNOSIS — M21371 Foot drop, right foot: Secondary | ICD-10-CM | POA: Diagnosis not present

## 2023-07-10 DIAGNOSIS — N401 Enlarged prostate with lower urinary tract symptoms: Secondary | ICD-10-CM | POA: Diagnosis not present

## 2023-07-10 DIAGNOSIS — Z6836 Body mass index (BMI) 36.0-36.9, adult: Secondary | ICD-10-CM | POA: Diagnosis not present

## 2023-07-10 DIAGNOSIS — E7800 Pure hypercholesterolemia, unspecified: Secondary | ICD-10-CM | POA: Diagnosis not present

## 2023-07-10 DIAGNOSIS — R7301 Impaired fasting glucose: Secondary | ICD-10-CM | POA: Diagnosis not present

## 2023-07-10 DIAGNOSIS — Z79899 Other long term (current) drug therapy: Secondary | ICD-10-CM | POA: Diagnosis not present

## 2023-07-10 DIAGNOSIS — M6281 Muscle weakness (generalized): Secondary | ICD-10-CM

## 2023-07-10 DIAGNOSIS — R262 Difficulty in walking, not elsewhere classified: Secondary | ICD-10-CM
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
## 2023-07-12 ENCOUNTER — Ambulatory Visit (HOSPITAL_BASED_OUTPATIENT_CLINIC_OR_DEPARTMENT_OTHER): Payer: Medicare Other | Admitting: Physical Therapy

## 2023-07-12 ENCOUNTER — Encounter (HOSPITAL_BASED_OUTPATIENT_CLINIC_OR_DEPARTMENT_OTHER): Payer: Self-pay | Admitting: Physical Therapy

## 2023-07-12 DIAGNOSIS — M6281 Muscle weakness (generalized): Secondary | ICD-10-CM

## 2023-07-12 DIAGNOSIS — R262 Difficulty in walking, not elsewhere classified: Secondary | ICD-10-CM
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
## 2023-07-16 DIAGNOSIS — N401 Enlarged prostate with lower urinary tract symptoms: Secondary | ICD-10-CM | POA: Diagnosis not present

## 2023-07-17 ENCOUNTER — Encounter (HOSPITAL_BASED_OUTPATIENT_CLINIC_OR_DEPARTMENT_OTHER): Payer: Self-pay | Admitting: Physical Therapy

## 2023-07-17 ENCOUNTER — Ambulatory Visit (HOSPITAL_BASED_OUTPATIENT_CLINIC_OR_DEPARTMENT_OTHER): Payer: Medicare Other | Attending: Family Medicine | Admitting: Physical Therapy

## 2023-07-17 DIAGNOSIS — M6281 Muscle weakness (generalized): Secondary | ICD-10-CM | POA: Diagnosis not present

## 2023-07-17 DIAGNOSIS — R262 Difficulty in walking, not elsewhere classified: Secondary | ICD-10-CM | POA: Diagnosis not present
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
## 2023-07-20 ENCOUNTER — Ambulatory Visit (HOSPITAL_BASED_OUTPATIENT_CLINIC_OR_DEPARTMENT_OTHER): Payer: Medicare Other | Admitting: Physical Therapy

## 2023-07-20 ENCOUNTER — Encounter (HOSPITAL_BASED_OUTPATIENT_CLINIC_OR_DEPARTMENT_OTHER): Payer: Self-pay | Admitting: Physical Therapy

## 2023-07-20 DIAGNOSIS — M6281 Muscle weakness (generalized): Secondary | ICD-10-CM

## 2023-07-20 DIAGNOSIS — R262 Difficulty in walking, not elsewhere classified: Secondary | ICD-10-CM
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
## 2023-07-23 ENCOUNTER — Ambulatory Visit (HOSPITAL_BASED_OUTPATIENT_CLINIC_OR_DEPARTMENT_OTHER): Payer: Medicare Other | Admitting: Physical Therapy

## 2023-07-23 ENCOUNTER — Encounter (HOSPITAL_BASED_OUTPATIENT_CLINIC_OR_DEPARTMENT_OTHER): Payer: Self-pay | Admitting: Physical Therapy

## 2023-07-23 DIAGNOSIS — R972 Elevated prostate specific antigen [PSA]: Secondary | ICD-10-CM | POA: Diagnosis not present

## 2023-07-23 DIAGNOSIS — N3941 Urge incontinence: Secondary | ICD-10-CM | POA: Diagnosis not present

## 2023-07-23 DIAGNOSIS — M6281 Muscle weakness (generalized): Secondary | ICD-10-CM | POA: Diagnosis not present

## 2023-07-23 DIAGNOSIS — R3914 Feeling of incomplete bladder emptying: Secondary | ICD-10-CM | POA: Diagnosis not present

## 2023-07-23 DIAGNOSIS — R262 Difficulty in walking, not elsewhere classified: Secondary | ICD-10-CM

## 2023-07-23 DIAGNOSIS — N401 Enlarged prostate with lower urinary tract symptoms: Secondary | ICD-10-CM | POA: Diagnosis not present
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
## 2023-07-24 ENCOUNTER — Encounter (HOSPITAL_BASED_OUTPATIENT_CLINIC_OR_DEPARTMENT_OTHER): Payer: PRIVATE HEALTH INSURANCE | Admitting: Physical Therapy

## 2023-07-27 ENCOUNTER — Ambulatory Visit (HOSPITAL_BASED_OUTPATIENT_CLINIC_OR_DEPARTMENT_OTHER): Payer: Medicare Other | Admitting: Physical Therapy

## 2023-07-27 ENCOUNTER — Encounter (HOSPITAL_BASED_OUTPATIENT_CLINIC_OR_DEPARTMENT_OTHER): Payer: Self-pay | Admitting: Physical Therapy

## 2023-07-27 DIAGNOSIS — R262 Difficulty in walking, not elsewhere classified: Secondary | ICD-10-CM | POA: Diagnosis not present

## 2023-07-27 DIAGNOSIS — M6281 Muscle weakness (generalized): Secondary | ICD-10-CM
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
## 2023-07-31 ENCOUNTER — Ambulatory Visit (HOSPITAL_BASED_OUTPATIENT_CLINIC_OR_DEPARTMENT_OTHER): Payer: Medicare Other | Admitting: Physical Therapy

## 2023-07-31 ENCOUNTER — Encounter (HOSPITAL_BASED_OUTPATIENT_CLINIC_OR_DEPARTMENT_OTHER): Payer: Self-pay | Admitting: Physical Therapy

## 2023-07-31 DIAGNOSIS — M6281 Muscle weakness (generalized): Secondary | ICD-10-CM

## 2023-07-31 DIAGNOSIS — R262 Difficulty in walking, not elsewhere classified: Secondary | ICD-10-CM | POA: Diagnosis not present
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
## 2023-08-03 ENCOUNTER — Encounter (HOSPITAL_BASED_OUTPATIENT_CLINIC_OR_DEPARTMENT_OTHER): Payer: Self-pay

## 2023-08-03 ENCOUNTER — Ambulatory Visit (HOSPITAL_BASED_OUTPATIENT_CLINIC_OR_DEPARTMENT_OTHER): Payer: Medicare Other | Admitting: Physical Therapy

## 2023-08-06 DIAGNOSIS — H903 Sensorineural hearing loss, bilateral: Secondary | ICD-10-CM | POA: Diagnosis not present

## 2023-08-06 DIAGNOSIS — H6123 Impacted cerumen, bilateral: Secondary | ICD-10-CM | POA: Diagnosis not present

## 2023-08-07 ENCOUNTER — Encounter (HOSPITAL_BASED_OUTPATIENT_CLINIC_OR_DEPARTMENT_OTHER): Payer: Self-pay | Admitting: Physical Therapy

## 2023-08-07 ENCOUNTER — Ambulatory Visit (HOSPITAL_BASED_OUTPATIENT_CLINIC_OR_DEPARTMENT_OTHER): Payer: Medicare Other | Admitting: Physical Therapy

## 2023-08-07 DIAGNOSIS — M6281 Muscle weakness (generalized): Secondary | ICD-10-CM

## 2023-08-07 DIAGNOSIS — R262 Difficulty in walking, not elsewhere classified: Secondary | ICD-10-CM
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
## 2023-08-10 ENCOUNTER — Ambulatory Visit (HOSPITAL_BASED_OUTPATIENT_CLINIC_OR_DEPARTMENT_OTHER): Payer: Medicare Other | Admitting: Physical Therapy

## 2023-08-10 ENCOUNTER — Telehealth (HOSPITAL_BASED_OUTPATIENT_CLINIC_OR_DEPARTMENT_OTHER): Payer: Self-pay | Admitting: Physical Therapy

## 2023-08-10 ENCOUNTER — Encounter (HOSPITAL_BASED_OUTPATIENT_CLINIC_OR_DEPARTMENT_OTHER): Payer: Self-pay | Admitting: Physical Therapy

## 2023-08-10 DIAGNOSIS — M6281 Muscle weakness (generalized): Secondary | ICD-10-CM

## 2023-08-10 DIAGNOSIS — R262 Difficulty in walking, not elsewhere classified: Secondary | ICD-10-CM | POA: Diagnosis not present
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
## 2023-08-10 NOTE — Telephone Encounter (Signed)
Message left with Pt's PCP Dr Peterson Ao at [facility] in regards to resending referral for a power WC to [facility] neuro [address]  Pt reports referral had been made but got a message that the MD sent it to the wrong place and has not heard back from anyone since.

## 2023-08-14 ENCOUNTER — Encounter (HOSPITAL_BASED_OUTPATIENT_CLINIC_OR_DEPARTMENT_OTHER): Payer: Self-pay | Admitting: Physical Therapy

## 2023-08-14 ENCOUNTER — Ambulatory Visit (HOSPITAL_BASED_OUTPATIENT_CLINIC_OR_DEPARTMENT_OTHER): Payer: Medicare Other | Admitting: Physical Therapy

## 2023-08-14 DIAGNOSIS — R262 Difficulty in walking, not elsewhere classified: Secondary | ICD-10-CM | POA: Diagnosis not present

## 2023-08-14 DIAGNOSIS — M6281 Muscle weakness (generalized): Secondary | ICD-10-CM | POA: Diagnosis not present
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
## 2023-08-17 ENCOUNTER — Ambulatory Visit (HOSPITAL_BASED_OUTPATIENT_CLINIC_OR_DEPARTMENT_OTHER): Payer: Medicare Other | Attending: Family Medicine | Admitting: Physical Therapy

## 2023-08-17 ENCOUNTER — Encounter (HOSPITAL_BASED_OUTPATIENT_CLINIC_OR_DEPARTMENT_OTHER): Payer: Self-pay | Admitting: Physical Therapy

## 2023-08-17 DIAGNOSIS — R262 Difficulty in walking, not elsewhere classified: Secondary | ICD-10-CM | POA: Insufficient documentation

## 2023-08-17 DIAGNOSIS — M6281 Muscle weakness (generalized): Secondary | ICD-10-CM | POA: Insufficient documentation
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
## 2023-08-21 ENCOUNTER — Ambulatory Visit (HOSPITAL_BASED_OUTPATIENT_CLINIC_OR_DEPARTMENT_OTHER): Payer: Medicare Other | Admitting: Physical Therapy

## 2023-08-21 DIAGNOSIS — M6281 Muscle weakness (generalized): Secondary | ICD-10-CM

## 2023-08-21 DIAGNOSIS — R262 Difficulty in walking, not elsewhere classified: Secondary | ICD-10-CM
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
## 2023-08-22 ENCOUNTER — Encounter (HOSPITAL_BASED_OUTPATIENT_CLINIC_OR_DEPARTMENT_OTHER): Payer: Self-pay | Admitting: Physical Therapy

## 2023-08-24 ENCOUNTER — Ambulatory Visit (HOSPITAL_BASED_OUTPATIENT_CLINIC_OR_DEPARTMENT_OTHER): Payer: Medicare Other | Admitting: Physical Therapy

## 2023-08-24 ENCOUNTER — Encounter (HOSPITAL_BASED_OUTPATIENT_CLINIC_OR_DEPARTMENT_OTHER): Payer: Self-pay | Admitting: Physical Therapy

## 2023-08-24 DIAGNOSIS — R262 Difficulty in walking, not elsewhere classified: Secondary | ICD-10-CM

## 2023-08-24 DIAGNOSIS — M6281 Muscle weakness (generalized): Secondary | ICD-10-CM | POA: Diagnosis not present
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
## 2023-08-27 ENCOUNTER — Ambulatory Visit (HOSPITAL_BASED_OUTPATIENT_CLINIC_OR_DEPARTMENT_OTHER): Payer: Medicare Other | Admitting: Physical Therapy

## 2023-08-27 ENCOUNTER — Encounter (HOSPITAL_BASED_OUTPATIENT_CLINIC_OR_DEPARTMENT_OTHER): Payer: Self-pay | Admitting: Physical Therapy

## 2023-08-27 DIAGNOSIS — R262 Difficulty in walking, not elsewhere classified: Secondary | ICD-10-CM | POA: Diagnosis not present

## 2023-08-27 DIAGNOSIS — M6281 Muscle weakness (generalized): Secondary | ICD-10-CM | POA: Diagnosis not present
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
## 2023-08-31 ENCOUNTER — Ambulatory Visit (HOSPITAL_BASED_OUTPATIENT_CLINIC_OR_DEPARTMENT_OTHER): Payer: Medicare Other | Admitting: Physical Therapy

## 2023-08-31 DIAGNOSIS — R262 Difficulty in walking, not elsewhere classified: Secondary | ICD-10-CM

## 2023-08-31 DIAGNOSIS — M6281 Muscle weakness (generalized): Secondary | ICD-10-CM
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
## 2023-09-04 DIAGNOSIS — G4733 Obstructive sleep apnea (adult) (pediatric): Secondary | ICD-10-CM | POA: Diagnosis not present

## 2023-09-07 ENCOUNTER — Encounter (HOSPITAL_BASED_OUTPATIENT_CLINIC_OR_DEPARTMENT_OTHER): Payer: Self-pay | Admitting: Physical Therapy

## 2023-09-07 ENCOUNTER — Ambulatory Visit (HOSPITAL_BASED_OUTPATIENT_CLINIC_OR_DEPARTMENT_OTHER): Payer: Medicare Other | Admitting: Physical Therapy

## 2023-09-07 DIAGNOSIS — M6281 Muscle weakness (generalized): Secondary | ICD-10-CM | POA: Diagnosis not present

## 2023-09-07 DIAGNOSIS — R262 Difficulty in walking, not elsewhere classified: Secondary | ICD-10-CM | POA: Diagnosis not present
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
## 2023-09-12 ENCOUNTER — Ambulatory Visit (HOSPITAL_BASED_OUTPATIENT_CLINIC_OR_DEPARTMENT_OTHER): Payer: Medicare Other | Admitting: Physical Therapy

## 2023-09-12 ENCOUNTER — Encounter (HOSPITAL_BASED_OUTPATIENT_CLINIC_OR_DEPARTMENT_OTHER): Payer: Self-pay | Admitting: Physical Therapy

## 2023-09-12 DIAGNOSIS — M6281 Muscle weakness (generalized): Secondary | ICD-10-CM

## 2023-09-12 DIAGNOSIS — R262 Difficulty in walking, not elsewhere classified: Secondary | ICD-10-CM | POA: Diagnosis not present
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
## 2023-09-20 ENCOUNTER — Encounter (HOSPITAL_BASED_OUTPATIENT_CLINIC_OR_DEPARTMENT_OTHER): Payer: Self-pay | Admitting: Physical Therapy

## 2023-09-20 ENCOUNTER — Ambulatory Visit (HOSPITAL_BASED_OUTPATIENT_CLINIC_OR_DEPARTMENT_OTHER): Payer: Medicare Other | Attending: Family Medicine | Admitting: Physical Therapy

## 2023-09-20 DIAGNOSIS — R262 Difficulty in walking, not elsewhere classified: Secondary | ICD-10-CM | POA: Insufficient documentation

## 2023-09-20 DIAGNOSIS — M6281 Muscle weakness (generalized): Secondary | ICD-10-CM | POA: Insufficient documentation
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
## 2023-09-26 ENCOUNTER — Ambulatory Visit (HOSPITAL_BASED_OUTPATIENT_CLINIC_OR_DEPARTMENT_OTHER): Payer: Medicare Other | Admitting: Physical Therapy

## 2023-09-26 DIAGNOSIS — M6281 Muscle weakness (generalized): Secondary | ICD-10-CM

## 2023-09-26 DIAGNOSIS — R262 Difficulty in walking, not elsewhere classified: Secondary | ICD-10-CM
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
## 2023-09-27 ENCOUNTER — Encounter (HOSPITAL_BASED_OUTPATIENT_CLINIC_OR_DEPARTMENT_OTHER): Payer: Self-pay | Admitting: Physical Therapy

## 2023-10-02 ENCOUNTER — Encounter (HOSPITAL_BASED_OUTPATIENT_CLINIC_OR_DEPARTMENT_OTHER): Payer: Self-pay | Admitting: Physical Therapy

## 2023-10-02 ENCOUNTER — Ambulatory Visit (HOSPITAL_BASED_OUTPATIENT_CLINIC_OR_DEPARTMENT_OTHER): Payer: Medicare Other | Admitting: Physical Therapy

## 2023-10-02 DIAGNOSIS — M6281 Muscle weakness (generalized): Secondary | ICD-10-CM

## 2023-10-02 DIAGNOSIS — R262 Difficulty in walking, not elsewhere classified: Secondary | ICD-10-CM | POA: Diagnosis not present
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
## 2023-10-05 ENCOUNTER — Ambulatory Visit (HOSPITAL_BASED_OUTPATIENT_CLINIC_OR_DEPARTMENT_OTHER): Payer: Medicare Other | Admitting: Physical Therapy

## 2023-10-05 ENCOUNTER — Encounter (HOSPITAL_BASED_OUTPATIENT_CLINIC_OR_DEPARTMENT_OTHER): Payer: Self-pay | Admitting: Physical Therapy

## 2023-10-05 DIAGNOSIS — R262 Difficulty in walking, not elsewhere classified: Secondary | ICD-10-CM | POA: Diagnosis not present

## 2023-10-05 DIAGNOSIS — M6281 Muscle weakness (generalized): Secondary | ICD-10-CM | POA: Diagnosis not present
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
## 2023-10-12 ENCOUNTER — Ambulatory Visit (HOSPITAL_BASED_OUTPATIENT_CLINIC_OR_DEPARTMENT_OTHER): Payer: Medicare Other | Admitting: Physical Therapy

## 2023-10-12 ENCOUNTER — Encounter (HOSPITAL_BASED_OUTPATIENT_CLINIC_OR_DEPARTMENT_OTHER): Payer: Self-pay | Admitting: Physical Therapy

## 2023-10-12 DIAGNOSIS — M6281 Muscle weakness (generalized): Secondary | ICD-10-CM | POA: Diagnosis not present

## 2023-10-12 DIAGNOSIS — R262 Difficulty in walking, not elsewhere classified: Secondary | ICD-10-CM | POA: Diagnosis not present
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
## 2023-10-16 ENCOUNTER — Encounter (HOSPITAL_BASED_OUTPATIENT_CLINIC_OR_DEPARTMENT_OTHER): Payer: Self-pay | Admitting: Physical Therapy

## 2023-10-16 ENCOUNTER — Ambulatory Visit (HOSPITAL_BASED_OUTPATIENT_CLINIC_OR_DEPARTMENT_OTHER): Payer: Medicare Other | Admitting: Physical Therapy

## 2023-10-16 DIAGNOSIS — M6281 Muscle weakness (generalized): Secondary | ICD-10-CM | POA: Diagnosis not present

## 2023-10-16 DIAGNOSIS — R262 Difficulty in walking, not elsewhere classified: Secondary | ICD-10-CM | POA: Diagnosis not present
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
## 2023-10-19 ENCOUNTER — Encounter (HOSPITAL_BASED_OUTPATIENT_CLINIC_OR_DEPARTMENT_OTHER): Payer: Self-pay | Admitting: Physical Therapy

## 2023-10-19 ENCOUNTER — Ambulatory Visit (HOSPITAL_BASED_OUTPATIENT_CLINIC_OR_DEPARTMENT_OTHER): Payer: Medicare Other | Attending: Family Medicine | Admitting: Physical Therapy

## 2023-10-19 DIAGNOSIS — M6281 Muscle weakness (generalized): Secondary | ICD-10-CM | POA: Insufficient documentation

## 2023-10-19 DIAGNOSIS — R262 Difficulty in walking, not elsewhere classified: Secondary | ICD-10-CM | POA: Diagnosis not present
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
## 2023-10-23 ENCOUNTER — Encounter (HOSPITAL_BASED_OUTPATIENT_CLINIC_OR_DEPARTMENT_OTHER): Payer: Self-pay | Admitting: Physical Therapy

## 2023-10-23 ENCOUNTER — Ambulatory Visit (HOSPITAL_BASED_OUTPATIENT_CLINIC_OR_DEPARTMENT_OTHER): Payer: Medicare Other | Admitting: Physical Therapy

## 2023-10-23 DIAGNOSIS — M6281 Muscle weakness (generalized): Secondary | ICD-10-CM

## 2023-10-23 DIAGNOSIS — R262 Difficulty in walking, not elsewhere classified: Secondary | ICD-10-CM
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
## 2023-10-26 ENCOUNTER — Ambulatory Visit (HOSPITAL_BASED_OUTPATIENT_CLINIC_OR_DEPARTMENT_OTHER): Payer: Medicare Other | Admitting: Physical Therapy

## 2023-10-26 ENCOUNTER — Encounter (HOSPITAL_BASED_OUTPATIENT_CLINIC_OR_DEPARTMENT_OTHER): Payer: Self-pay | Admitting: Physical Therapy

## 2023-10-26 DIAGNOSIS — M6281 Muscle weakness (generalized): Secondary | ICD-10-CM | POA: Diagnosis not present

## 2023-10-26 DIAGNOSIS — R262 Difficulty in walking, not elsewhere classified: Secondary | ICD-10-CM | POA: Diagnosis not present
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
## 2023-10-30 ENCOUNTER — Encounter (HOSPITAL_BASED_OUTPATIENT_CLINIC_OR_DEPARTMENT_OTHER): Payer: Self-pay

## 2023-10-30 ENCOUNTER — Ambulatory Visit (HOSPITAL_BASED_OUTPATIENT_CLINIC_OR_DEPARTMENT_OTHER): Payer: Medicare Other | Admitting: Physical Therapy

## 2023-11-02 ENCOUNTER — Ambulatory Visit (HOSPITAL_BASED_OUTPATIENT_CLINIC_OR_DEPARTMENT_OTHER): Payer: Medicare Other | Admitting: Physical Therapy

## 2023-11-02 ENCOUNTER — Encounter (HOSPITAL_BASED_OUTPATIENT_CLINIC_OR_DEPARTMENT_OTHER): Payer: Self-pay | Admitting: Physical Therapy

## 2023-11-02 DIAGNOSIS — R262 Difficulty in walking, not elsewhere classified: Secondary | ICD-10-CM

## 2023-11-02 DIAGNOSIS — M6281 Muscle weakness (generalized): Secondary | ICD-10-CM
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
## 2023-11-06 ENCOUNTER — Ambulatory Visit (HOSPITAL_BASED_OUTPATIENT_CLINIC_OR_DEPARTMENT_OTHER): Payer: Medicare Other | Admitting: Physical Therapy

## 2023-11-06 DIAGNOSIS — M6281 Muscle weakness (generalized): Secondary | ICD-10-CM | POA: Diagnosis not present

## 2023-11-06 DIAGNOSIS — R262 Difficulty in walking, not elsewhere classified: Secondary | ICD-10-CM | POA: Diagnosis not present
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
## 2023-11-07 ENCOUNTER — Encounter (HOSPITAL_BASED_OUTPATIENT_CLINIC_OR_DEPARTMENT_OTHER): Payer: Self-pay | Admitting: Physical Therapy

## 2023-11-12 DIAGNOSIS — L603 Nail dystrophy: Secondary | ICD-10-CM | POA: Diagnosis not present

## 2023-11-12 DIAGNOSIS — L84 Corns and callosities: Secondary | ICD-10-CM | POA: Diagnosis not present

## 2023-11-12 DIAGNOSIS — I739 Peripheral vascular disease, unspecified: Secondary | ICD-10-CM | POA: Diagnosis not present

## 2023-11-13 ENCOUNTER — Ambulatory Visit: Payer: Medicare Other | Attending: Family Medicine | Admitting: Physical Therapy

## 2023-11-13 DIAGNOSIS — R262 Difficulty in walking, not elsewhere classified: Secondary | ICD-10-CM | POA: Insufficient documentation

## 2023-11-13 DIAGNOSIS — M6281 Muscle weakness (generalized): Secondary | ICD-10-CM | POA: Diagnosis not present
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
## 2023-11-20 ENCOUNTER — Ambulatory Visit (HOSPITAL_BASED_OUTPATIENT_CLINIC_OR_DEPARTMENT_OTHER): Payer: Medicare Other | Attending: Family Medicine | Admitting: Physical Therapy

## 2023-11-20 DIAGNOSIS — M6281 Muscle weakness (generalized): Secondary | ICD-10-CM | POA: Diagnosis not present

## 2023-11-20 DIAGNOSIS — R262 Difficulty in walking, not elsewhere classified: Secondary | ICD-10-CM | POA: Diagnosis not present
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
## 2023-11-21 ENCOUNTER — Encounter (HOSPITAL_BASED_OUTPATIENT_CLINIC_OR_DEPARTMENT_OTHER): Payer: Self-pay | Admitting: Physical Therapy

## 2023-11-23 ENCOUNTER — Ambulatory Visit (HOSPITAL_BASED_OUTPATIENT_CLINIC_OR_DEPARTMENT_OTHER): Payer: Medicare Other | Admitting: Physical Therapy

## 2023-11-23 ENCOUNTER — Encounter (HOSPITAL_BASED_OUTPATIENT_CLINIC_OR_DEPARTMENT_OTHER): Payer: Self-pay | Admitting: Physical Therapy

## 2023-11-23 DIAGNOSIS — R262 Difficulty in walking, not elsewhere classified: Secondary | ICD-10-CM

## 2023-11-23 DIAGNOSIS — M6281 Muscle weakness (generalized): Secondary | ICD-10-CM | POA: Diagnosis not present
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
## 2023-11-24 ENCOUNTER — Encounter (HOSPITAL_BASED_OUTPATIENT_CLINIC_OR_DEPARTMENT_OTHER): Payer: Self-pay | Admitting: Physical Therapy

## 2023-11-27 ENCOUNTER — Ambulatory Visit (HOSPITAL_BASED_OUTPATIENT_CLINIC_OR_DEPARTMENT_OTHER): Payer: Medicare Other | Admitting: Physical Therapy

## 2023-11-27 ENCOUNTER — Encounter (HOSPITAL_BASED_OUTPATIENT_CLINIC_OR_DEPARTMENT_OTHER): Payer: Self-pay | Admitting: Physical Therapy

## 2023-11-27 DIAGNOSIS — M6281 Muscle weakness (generalized): Secondary | ICD-10-CM | POA: Diagnosis not present

## 2023-11-27 DIAGNOSIS — R262 Difficulty in walking, not elsewhere classified: Secondary | ICD-10-CM | POA: Diagnosis not present
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
## 2023-11-30 ENCOUNTER — Ambulatory Visit (HOSPITAL_BASED_OUTPATIENT_CLINIC_OR_DEPARTMENT_OTHER): Payer: Medicare Other | Admitting: Physical Therapy

## 2023-11-30 ENCOUNTER — Encounter (HOSPITAL_BASED_OUTPATIENT_CLINIC_OR_DEPARTMENT_OTHER): Payer: Self-pay | Admitting: Physical Therapy

## 2023-11-30 DIAGNOSIS — M6281 Muscle weakness (generalized): Secondary | ICD-10-CM

## 2023-11-30 DIAGNOSIS — R262 Difficulty in walking, not elsewhere classified: Secondary | ICD-10-CM
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
## 2023-12-04 ENCOUNTER — Ambulatory Visit (HOSPITAL_BASED_OUTPATIENT_CLINIC_OR_DEPARTMENT_OTHER): Payer: Medicare Other | Admitting: Physical Therapy

## 2023-12-04 ENCOUNTER — Encounter (HOSPITAL_BASED_OUTPATIENT_CLINIC_OR_DEPARTMENT_OTHER): Payer: Self-pay | Admitting: Physical Therapy

## 2023-12-04 DIAGNOSIS — M6281 Muscle weakness (generalized): Secondary | ICD-10-CM | POA: Diagnosis not present

## 2023-12-04 DIAGNOSIS — R262 Difficulty in walking, not elsewhere classified: Secondary | ICD-10-CM | POA: Diagnosis not present
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
## 2023-12-07 ENCOUNTER — Encounter (HOSPITAL_BASED_OUTPATIENT_CLINIC_OR_DEPARTMENT_OTHER): Payer: Self-pay | Admitting: Physical Therapy

## 2023-12-07 ENCOUNTER — Ambulatory Visit (HOSPITAL_BASED_OUTPATIENT_CLINIC_OR_DEPARTMENT_OTHER): Payer: Medicare Other | Admitting: Physical Therapy

## 2023-12-07 DIAGNOSIS — R262 Difficulty in walking, not elsewhere classified: Secondary | ICD-10-CM

## 2023-12-07 DIAGNOSIS — M6281 Muscle weakness (generalized): Secondary | ICD-10-CM
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
## 2023-12-10 DIAGNOSIS — R600 Localized edema: Secondary | ICD-10-CM | POA: Diagnosis not present

## 2023-12-10 DIAGNOSIS — M21371 Foot drop, right foot: Secondary | ICD-10-CM | POA: Diagnosis not present

## 2023-12-10 DIAGNOSIS — M170 Bilateral primary osteoarthritis of knee: Secondary | ICD-10-CM | POA: Diagnosis not present

## 2023-12-11 ENCOUNTER — Ambulatory Visit (HOSPITAL_BASED_OUTPATIENT_CLINIC_OR_DEPARTMENT_OTHER): Payer: Medicare Other | Admitting: Physical Therapy

## 2023-12-11 DIAGNOSIS — R262 Difficulty in walking, not elsewhere classified: Secondary | ICD-10-CM

## 2023-12-11 DIAGNOSIS — M6281 Muscle weakness (generalized): Secondary | ICD-10-CM
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
## 2023-12-12 ENCOUNTER — Encounter (HOSPITAL_BASED_OUTPATIENT_CLINIC_OR_DEPARTMENT_OTHER): Payer: Self-pay | Admitting: Physical Therapy

## 2023-12-14 ENCOUNTER — Encounter (HOSPITAL_BASED_OUTPATIENT_CLINIC_OR_DEPARTMENT_OTHER): Payer: Self-pay | Admitting: Physical Therapy

## 2023-12-14 ENCOUNTER — Ambulatory Visit (HOSPITAL_BASED_OUTPATIENT_CLINIC_OR_DEPARTMENT_OTHER): Payer: Medicare Other | Admitting: Physical Therapy

## 2023-12-14 DIAGNOSIS — M6281 Muscle weakness (generalized): Secondary | ICD-10-CM

## 2023-12-14 DIAGNOSIS — R262 Difficulty in walking, not elsewhere classified: Secondary | ICD-10-CM | POA: Diagnosis not present
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
## 2023-12-18 ENCOUNTER — Ambulatory Visit (HOSPITAL_BASED_OUTPATIENT_CLINIC_OR_DEPARTMENT_OTHER): Payer: Medicare Other | Attending: Family Medicine | Admitting: Physical Therapy

## 2023-12-18 DIAGNOSIS — M6281 Muscle weakness (generalized): Secondary | ICD-10-CM | POA: Insufficient documentation

## 2023-12-18 DIAGNOSIS — R262 Difficulty in walking, not elsewhere classified: Secondary | ICD-10-CM | POA: Insufficient documentation
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
## 2023-12-19 ENCOUNTER — Encounter (HOSPITAL_BASED_OUTPATIENT_CLINIC_OR_DEPARTMENT_OTHER): Payer: Self-pay | Admitting: Physical Therapy

## 2023-12-21 ENCOUNTER — Ambulatory Visit (HOSPITAL_BASED_OUTPATIENT_CLINIC_OR_DEPARTMENT_OTHER): Payer: 59 | Admitting: Physical Therapy

## 2023-12-21 ENCOUNTER — Encounter (HOSPITAL_BASED_OUTPATIENT_CLINIC_OR_DEPARTMENT_OTHER): Payer: Self-pay | Admitting: Physical Therapy

## 2023-12-21 DIAGNOSIS — M6281 Muscle weakness (generalized): Secondary | ICD-10-CM | POA: Diagnosis not present

## 2023-12-21 DIAGNOSIS — R262 Difficulty in walking, not elsewhere classified: Secondary | ICD-10-CM | POA: Diagnosis not present
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
## 2023-12-25 ENCOUNTER — Ambulatory Visit (HOSPITAL_BASED_OUTPATIENT_CLINIC_OR_DEPARTMENT_OTHER): Payer: PRIVATE HEALTH INSURANCE | Admitting: Physical Therapy

## 2023-12-25 ENCOUNTER — Encounter (HOSPITAL_BASED_OUTPATIENT_CLINIC_OR_DEPARTMENT_OTHER): Payer: Self-pay | Admitting: Physical Therapy

## 2023-12-25 DIAGNOSIS — R262 Difficulty in walking, not elsewhere classified: Secondary | ICD-10-CM | POA: Diagnosis not present

## 2023-12-25 DIAGNOSIS — M6281 Muscle weakness (generalized): Secondary | ICD-10-CM
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
## 2023-12-28 ENCOUNTER — Encounter (HOSPITAL_BASED_OUTPATIENT_CLINIC_OR_DEPARTMENT_OTHER): Payer: Self-pay | Admitting: Physical Therapy

## 2023-12-28 ENCOUNTER — Ambulatory Visit (HOSPITAL_BASED_OUTPATIENT_CLINIC_OR_DEPARTMENT_OTHER): Payer: PRIVATE HEALTH INSURANCE | Admitting: Physical Therapy

## 2023-12-28 DIAGNOSIS — M6281 Muscle weakness (generalized): Secondary | ICD-10-CM

## 2023-12-28 DIAGNOSIS — R262 Difficulty in walking, not elsewhere classified: Secondary | ICD-10-CM
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
## 2024-01-01 ENCOUNTER — Encounter (HOSPITAL_BASED_OUTPATIENT_CLINIC_OR_DEPARTMENT_OTHER): Payer: Self-pay | Admitting: Physical Therapy

## 2024-01-01 ENCOUNTER — Ambulatory Visit (HOSPITAL_BASED_OUTPATIENT_CLINIC_OR_DEPARTMENT_OTHER): Payer: PRIVATE HEALTH INSURANCE | Admitting: Physical Therapy

## 2024-01-01 DIAGNOSIS — R262 Difficulty in walking, not elsewhere classified: Secondary | ICD-10-CM | POA: Diagnosis not present

## 2024-01-01 DIAGNOSIS — M6281 Muscle weakness (generalized): Secondary | ICD-10-CM | POA: Diagnosis not present
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
## 2024-01-08 DIAGNOSIS — M21371 Foot drop, right foot: Secondary | ICD-10-CM | POA: Diagnosis not present

## 2024-01-08 DIAGNOSIS — R7301 Impaired fasting glucose: Secondary | ICD-10-CM | POA: Diagnosis not present

## 2024-01-08 DIAGNOSIS — Z1331 Encounter for screening for depression: Secondary | ICD-10-CM | POA: Diagnosis not present

## 2024-01-08 DIAGNOSIS — Z79899 Other long term (current) drug therapy: Secondary | ICD-10-CM | POA: Diagnosis not present

## 2024-01-08 DIAGNOSIS — Z0001 Encounter for general adult medical examination with abnormal findings: Secondary | ICD-10-CM | POA: Diagnosis not present

## 2024-01-08 DIAGNOSIS — D696 Thrombocytopenia, unspecified: Secondary | ICD-10-CM | POA: Diagnosis not present

## 2024-01-08 DIAGNOSIS — E7800 Pure hypercholesterolemia, unspecified: Secondary | ICD-10-CM | POA: Diagnosis not present

## 2024-01-21 DIAGNOSIS — H6123 Impacted cerumen, bilateral: Secondary | ICD-10-CM | POA: Diagnosis not present

## 2024-01-28 DIAGNOSIS — R351 Nocturia: Secondary | ICD-10-CM | POA: Diagnosis not present

## 2024-01-28 DIAGNOSIS — N3941 Urge incontinence: Secondary | ICD-10-CM | POA: Diagnosis not present

## 2024-01-28 DIAGNOSIS — N401 Enlarged prostate with lower urinary tract symptoms: Secondary | ICD-10-CM | POA: Diagnosis not present

## 2024-02-13 DIAGNOSIS — E7800 Pure hypercholesterolemia, unspecified: Secondary | ICD-10-CM | POA: Diagnosis not present

## 2024-02-13 DIAGNOSIS — M170 Bilateral primary osteoarthritis of knee: Secondary | ICD-10-CM | POA: Diagnosis not present

## 2024-02-13 DIAGNOSIS — N401 Enlarged prostate with lower urinary tract symptoms: Secondary | ICD-10-CM | POA: Diagnosis not present

## 2024-02-13 DIAGNOSIS — M1612 Unilateral primary osteoarthritis, left hip: Secondary | ICD-10-CM | POA: Diagnosis not present

## 2024-02-20 ENCOUNTER — Ambulatory Visit: Admitting: Podiatry

## 2024-02-20 ENCOUNTER — Encounter: Payer: Self-pay | Admitting: Podiatry

## 2024-02-20 DIAGNOSIS — L84 Corns and callosities: Secondary | ICD-10-CM

## 2024-02-20 DIAGNOSIS — L603 Nail dystrophy: Secondary | ICD-10-CM | POA: Diagnosis not present

## 2024-02-20 DIAGNOSIS — I739 Peripheral vascular disease, unspecified: Secondary | ICD-10-CM | POA: Diagnosis not present

## 2024-02-20 NOTE — Progress Notes (Signed)
 Patient presents for evaluation and treatment of tenderness around nails feet and hyperkeratotic lesions.   Physical exam:  General appearance: Alert, pleasant, and in no acute distress  Vascular: Pedal pulses: DP nonpalpable bilaterally, PT nonpalpable bilaterally.  Severe edema lower legs bilaterally Paresthesias lower legs bilaterally  Dermatologic:  Hyperkeratotic lesions on distal aspect fourth toe of the left and plantar medial IPJ hallux right. Nails thickened and dystrophic 1-5 BL. Skin thinned.  Decreased hair lower limbs BL.  Skin hyperpigmentation lower legs BL.  Musculoskeletal: Hammertoes 2 through 5 bilateral   Diagnosis: Painful hyperkeratotic lesions feet bilaterally. Dystrophic nails 1-5 BL. PAD/PVD  Q 8  Plan: Debrided hyperkeratotic lesions feet x 2. Debrided dystrophic thickened nails 1 through 5 bilaterally.  Return 3 months

## 2024-03-06 DIAGNOSIS — N3941 Urge incontinence: Secondary | ICD-10-CM | POA: Diagnosis not present

## 2024-03-06 DIAGNOSIS — R350 Frequency of micturition: Secondary | ICD-10-CM | POA: Diagnosis not present

## 2024-03-13 DIAGNOSIS — R350 Frequency of micturition: Secondary | ICD-10-CM | POA: Diagnosis not present

## 2024-03-13 DIAGNOSIS — N3941 Urge incontinence: Secondary | ICD-10-CM | POA: Diagnosis not present

## 2024-03-15 DIAGNOSIS — N401 Enlarged prostate with lower urinary tract symptoms: Secondary | ICD-10-CM | POA: Diagnosis not present

## 2024-03-15 DIAGNOSIS — M170 Bilateral primary osteoarthritis of knee: Secondary | ICD-10-CM | POA: Diagnosis not present

## 2024-03-15 DIAGNOSIS — M1612 Unilateral primary osteoarthritis, left hip: Secondary | ICD-10-CM | POA: Diagnosis not present

## 2024-03-15 DIAGNOSIS — E7800 Pure hypercholesterolemia, unspecified: Secondary | ICD-10-CM | POA: Diagnosis not present

## 2024-03-20 DIAGNOSIS — R350 Frequency of micturition: Secondary | ICD-10-CM | POA: Diagnosis not present

## 2024-03-20 DIAGNOSIS — N3941 Urge incontinence: Secondary | ICD-10-CM | POA: Diagnosis not present

## 2024-03-27 DIAGNOSIS — R350 Frequency of micturition: Secondary | ICD-10-CM | POA: Diagnosis not present

## 2024-03-27 DIAGNOSIS — N3941 Urge incontinence: Secondary | ICD-10-CM | POA: Diagnosis not present

## 2024-04-03 DIAGNOSIS — N3941 Urge incontinence: Secondary | ICD-10-CM | POA: Diagnosis not present

## 2024-04-03 DIAGNOSIS — R350 Frequency of micturition: Secondary | ICD-10-CM | POA: Diagnosis not present

## 2024-04-10 DIAGNOSIS — N3941 Urge incontinence: Secondary | ICD-10-CM | POA: Diagnosis not present

## 2024-04-10 DIAGNOSIS — R350 Frequency of micturition: Secondary | ICD-10-CM | POA: Diagnosis not present

## 2024-04-14 DIAGNOSIS — N401 Enlarged prostate with lower urinary tract symptoms: Secondary | ICD-10-CM | POA: Diagnosis not present

## 2024-04-14 DIAGNOSIS — E7800 Pure hypercholesterolemia, unspecified: Secondary | ICD-10-CM | POA: Diagnosis not present

## 2024-04-14 DIAGNOSIS — M1612 Unilateral primary osteoarthritis, left hip: Secondary | ICD-10-CM | POA: Diagnosis not present

## 2024-04-14 DIAGNOSIS — M170 Bilateral primary osteoarthritis of knee: Secondary | ICD-10-CM | POA: Diagnosis not present

## 2024-04-24 DIAGNOSIS — N3941 Urge incontinence: Secondary | ICD-10-CM | POA: Diagnosis not present

## 2024-04-24 DIAGNOSIS — R350 Frequency of micturition: Secondary | ICD-10-CM | POA: Diagnosis not present

## 2024-05-01 DIAGNOSIS — N3941 Urge incontinence: Secondary | ICD-10-CM | POA: Diagnosis not present

## 2024-05-01 DIAGNOSIS — R350 Frequency of micturition: Secondary | ICD-10-CM | POA: Diagnosis not present

## 2024-05-08 DIAGNOSIS — N3941 Urge incontinence: Secondary | ICD-10-CM | POA: Diagnosis not present

## 2024-05-08 DIAGNOSIS — R350 Frequency of micturition: Secondary | ICD-10-CM | POA: Diagnosis not present

## 2024-05-15 DIAGNOSIS — E7800 Pure hypercholesterolemia, unspecified: Secondary | ICD-10-CM | POA: Diagnosis not present

## 2024-05-15 DIAGNOSIS — N401 Enlarged prostate with lower urinary tract symptoms: Secondary | ICD-10-CM | POA: Diagnosis not present

## 2024-05-15 DIAGNOSIS — M1612 Unilateral primary osteoarthritis, left hip: Secondary | ICD-10-CM | POA: Diagnosis not present

## 2024-05-15 DIAGNOSIS — M170 Bilateral primary osteoarthritis of knee: Secondary | ICD-10-CM | POA: Diagnosis not present

## 2024-05-22 DIAGNOSIS — R350 Frequency of micturition: Secondary | ICD-10-CM | POA: Diagnosis not present

## 2024-05-22 DIAGNOSIS — N3941 Urge incontinence: Secondary | ICD-10-CM | POA: Diagnosis not present

## 2024-05-26 ENCOUNTER — Encounter: Payer: Self-pay | Admitting: Podiatry

## 2024-05-26 ENCOUNTER — Ambulatory Visit (INDEPENDENT_AMBULATORY_CARE_PROVIDER_SITE_OTHER): Admitting: Podiatry

## 2024-05-26 DIAGNOSIS — B351 Tinea unguium: Secondary | ICD-10-CM | POA: Diagnosis not present

## 2024-05-26 DIAGNOSIS — M79672 Pain in left foot: Secondary | ICD-10-CM | POA: Diagnosis not present

## 2024-05-26 DIAGNOSIS — M79671 Pain in right foot: Secondary | ICD-10-CM | POA: Diagnosis not present

## 2024-05-26 NOTE — Progress Notes (Signed)
 Patient presents for evaluation and treatment of tenderness and some redness around nails feet.  Tenderness around toes with walking and wearing shoes.  Physical exam:  General appearance: Alert, pleasant, and in no acute distress.  Vascular: Pedal pulses: DP 1/4 B/L, PT 0/4 B/L. severe edema lower legs bilaterally  Neurological:    Dermatologic:  Nails thickened, disfigured, discolored 1-5 BL with subungual debris.  Redness and hypertrophic nail folds along nail folds bilaterally but no signs of drainage or infection.  Musculoskeletal:     Diagnosis: 1. Painful onychomycotic nails 1 through 5 bilaterally. 2. Pain toes 1 through 5 bilaterally.  Plan: Debrided onychomycotic nails 1 through 5 bilaterally.  Return 3 months RFC

## 2024-05-29 DIAGNOSIS — R350 Frequency of micturition: Secondary | ICD-10-CM | POA: Diagnosis not present

## 2024-05-29 DIAGNOSIS — N3941 Urge incontinence: Secondary | ICD-10-CM | POA: Diagnosis not present

## 2024-06-05 DIAGNOSIS — R350 Frequency of micturition: Secondary | ICD-10-CM | POA: Diagnosis not present

## 2024-06-05 DIAGNOSIS — N3941 Urge incontinence: Secondary | ICD-10-CM | POA: Diagnosis not present

## 2024-06-15 DIAGNOSIS — E7800 Pure hypercholesterolemia, unspecified: Secondary | ICD-10-CM | POA: Diagnosis not present

## 2024-06-15 DIAGNOSIS — M170 Bilateral primary osteoarthritis of knee: Secondary | ICD-10-CM | POA: Diagnosis not present

## 2024-06-15 DIAGNOSIS — M1612 Unilateral primary osteoarthritis, left hip: Secondary | ICD-10-CM | POA: Diagnosis not present

## 2024-06-15 DIAGNOSIS — N401 Enlarged prostate with lower urinary tract symptoms: Secondary | ICD-10-CM | POA: Diagnosis not present

## 2024-07-03 DIAGNOSIS — N3941 Urge incontinence: Secondary | ICD-10-CM | POA: Diagnosis not present

## 2024-07-03 DIAGNOSIS — R350 Frequency of micturition: Secondary | ICD-10-CM | POA: Diagnosis not present

## 2024-07-09 DIAGNOSIS — H6123 Impacted cerumen, bilateral: Secondary | ICD-10-CM | POA: Diagnosis not present

## 2024-07-09 DIAGNOSIS — H903 Sensorineural hearing loss, bilateral: Secondary | ICD-10-CM | POA: Diagnosis not present

## 2024-07-09 DIAGNOSIS — Z974 Presence of external hearing-aid: Secondary | ICD-10-CM | POA: Diagnosis not present

## 2024-07-10 DIAGNOSIS — E7800 Pure hypercholesterolemia, unspecified: Secondary | ICD-10-CM | POA: Diagnosis not present

## 2024-07-10 DIAGNOSIS — M21371 Foot drop, right foot: Secondary | ICD-10-CM | POA: Diagnosis not present

## 2024-07-10 DIAGNOSIS — R7301 Impaired fasting glucose: Secondary | ICD-10-CM | POA: Diagnosis not present

## 2024-07-10 DIAGNOSIS — D696 Thrombocytopenia, unspecified: Secondary | ICD-10-CM | POA: Diagnosis not present

## 2024-07-10 DIAGNOSIS — Z79899 Other long term (current) drug therapy: Secondary | ICD-10-CM | POA: Diagnosis not present

## 2024-07-15 DIAGNOSIS — E7800 Pure hypercholesterolemia, unspecified: Secondary | ICD-10-CM | POA: Diagnosis not present

## 2024-07-15 DIAGNOSIS — M170 Bilateral primary osteoarthritis of knee: Secondary | ICD-10-CM | POA: Diagnosis not present

## 2024-07-15 DIAGNOSIS — N401 Enlarged prostate with lower urinary tract symptoms: Secondary | ICD-10-CM | POA: Diagnosis not present

## 2024-07-15 DIAGNOSIS — M1612 Unilateral primary osteoarthritis, left hip: Secondary | ICD-10-CM | POA: Diagnosis not present

## 2024-07-31 DIAGNOSIS — R350 Frequency of micturition: Secondary | ICD-10-CM | POA: Diagnosis not present

## 2024-07-31 DIAGNOSIS — Z23 Encounter for immunization: Secondary | ICD-10-CM | POA: Diagnosis not present

## 2024-08-15 DIAGNOSIS — N401 Enlarged prostate with lower urinary tract symptoms: Secondary | ICD-10-CM | POA: Diagnosis not present

## 2024-08-15 DIAGNOSIS — M1612 Unilateral primary osteoarthritis, left hip: Secondary | ICD-10-CM | POA: Diagnosis not present

## 2024-08-15 DIAGNOSIS — M170 Bilateral primary osteoarthritis of knee: Secondary | ICD-10-CM | POA: Diagnosis not present

## 2024-08-15 DIAGNOSIS — E7800 Pure hypercholesterolemia, unspecified: Secondary | ICD-10-CM | POA: Diagnosis not present

## 2024-08-18 DIAGNOSIS — R351 Nocturia: Secondary | ICD-10-CM | POA: Diagnosis not present

## 2024-08-18 DIAGNOSIS — N3941 Urge incontinence: Secondary | ICD-10-CM | POA: Diagnosis not present

## 2024-08-18 DIAGNOSIS — R350 Frequency of micturition: Secondary | ICD-10-CM | POA: Diagnosis not present

## 2024-08-26 ENCOUNTER — Ambulatory Visit: Admitting: Podiatry

## 2024-08-28 DIAGNOSIS — R350 Frequency of micturition: Secondary | ICD-10-CM | POA: Diagnosis not present

## 2024-09-01 ENCOUNTER — Ambulatory Visit: Admitting: Podiatry

## 2024-09-01 DIAGNOSIS — H903 Sensorineural hearing loss, bilateral: Secondary | ICD-10-CM | POA: Diagnosis not present

## 2024-09-09 DIAGNOSIS — G4733 Obstructive sleep apnea (adult) (pediatric): Secondary | ICD-10-CM | POA: Diagnosis not present

## 2024-09-14 DIAGNOSIS — M1612 Unilateral primary osteoarthritis, left hip: Secondary | ICD-10-CM | POA: Diagnosis not present

## 2024-09-14 DIAGNOSIS — M170 Bilateral primary osteoarthritis of knee: Secondary | ICD-10-CM | POA: Diagnosis not present

## 2024-09-14 DIAGNOSIS — N401 Enlarged prostate with lower urinary tract symptoms: Secondary | ICD-10-CM | POA: Diagnosis not present

## 2024-09-14 DIAGNOSIS — E7800 Pure hypercholesterolemia, unspecified: Secondary | ICD-10-CM | POA: Diagnosis not present

## 2024-09-15 ENCOUNTER — Ambulatory Visit (INDEPENDENT_AMBULATORY_CARE_PROVIDER_SITE_OTHER): Admitting: Podiatry

## 2024-09-15 DIAGNOSIS — M79671 Pain in right foot: Secondary | ICD-10-CM | POA: Diagnosis not present

## 2024-09-15 DIAGNOSIS — B351 Tinea unguium: Secondary | ICD-10-CM

## 2024-09-15 DIAGNOSIS — M79672 Pain in left foot: Secondary | ICD-10-CM | POA: Diagnosis not present

## 2024-09-15 NOTE — Progress Notes (Signed)
 Patient presents for evaluation and treatment of tenderness and some redness around nails feet.  Tenderness around toes with walking and wearing shoes.  Physical exam:  General appearance: Alert, pleasant, and in no acute distress.  Vascular: Pedal pulses: DP 1/4 B/L, PT 2/4 B/L.  Severe edema lower legs bilaterally.  Capillary refill time immediate bilaterally  Neurologic:  Dermatologic:  Nails thickened, disfigured, discolored 1-5 BL with subungual debris.  Redness and hypertrophic nail folds along nail folds bilaterally but no signs of drainage or infection.  Musculoskeletal:     Diagnosis: 1. Painful onychomycotic nails 1 through 5 bilaterally. 2. Pain toes 1 through 5 bilaterally.  Plan: -Debrided onychomycotic nails 1 through 5 bilaterally.  Sharply debrided nails with nail clipper and reduced with a power bur.  Return 3 months RFC

## 2024-10-02 ENCOUNTER — Other Ambulatory Visit: Payer: Self-pay

## 2024-10-02 ENCOUNTER — Emergency Department (HOSPITAL_COMMUNITY)
Admission: EM | Admit: 2024-10-02 | Source: Home / Self Care | Attending: Emergency Medicine | Admitting: Emergency Medicine

## 2024-10-02 ENCOUNTER — Encounter (HOSPITAL_COMMUNITY): Payer: Self-pay

## 2024-10-02 DIAGNOSIS — I872 Venous insufficiency (chronic) (peripheral): Secondary | ICD-10-CM | POA: Insufficient documentation

## 2024-10-02 DIAGNOSIS — R600 Localized edema: Secondary | ICD-10-CM | POA: Insufficient documentation

## 2024-10-02 DIAGNOSIS — R7401 Elevation of levels of liver transaminase levels: Secondary | ICD-10-CM | POA: Diagnosis not present

## 2024-10-02 DIAGNOSIS — M7989 Other specified soft tissue disorders: Secondary | ICD-10-CM | POA: Diagnosis present

## 2024-10-02 LAB — CBC WITH DIFFERENTIAL/PLATELET
Abs Immature Granulocytes: 0.08 K/uL — ABNORMAL HIGH (ref 0.00–0.07)
Basophils Absolute: 0.1 K/uL (ref 0.0–0.1)
Basophils Relative: 1 %
Eosinophils Absolute: 0 K/uL (ref 0.0–0.5)
Eosinophils Relative: 0 %
HCT: 43.1 % (ref 39.0–52.0)
Hemoglobin: 13.3 g/dL (ref 13.0–17.0)
Immature Granulocytes: 1 %
Lymphocytes Relative: 18 %
Lymphs Abs: 1.8 K/uL (ref 0.7–4.0)
MCH: 27.5 pg (ref 26.0–34.0)
MCHC: 30.9 g/dL (ref 30.0–36.0)
MCV: 89 fL (ref 80.0–100.0)
Monocytes Absolute: 1.1 K/uL — ABNORMAL HIGH (ref 0.1–1.0)
Monocytes Relative: 10 %
Neutro Abs: 7.1 K/uL (ref 1.7–7.7)
Neutrophils Relative %: 70 %
Platelets: 206 K/uL (ref 150–400)
RBC: 4.84 MIL/uL (ref 4.22–5.81)
RDW: 14.3 % (ref 11.5–15.5)
WBC: 10.1 K/uL (ref 4.0–10.5)
nRBC: 0 % (ref 0.0–0.2)

## 2024-10-02 LAB — COMPREHENSIVE METABOLIC PANEL WITH GFR
ALT: 58 U/L — ABNORMAL HIGH (ref 0–44)
AST: 49 U/L — ABNORMAL HIGH (ref 15–41)
Albumin: 3.7 g/dL (ref 3.5–5.0)
Alkaline Phosphatase: 91 U/L (ref 38–126)
Anion gap: 9 (ref 5–15)
BUN: 18 mg/dL (ref 8–23)
CO2: 26 mmol/L (ref 22–32)
Calcium: 9 mg/dL (ref 8.9–10.3)
Chloride: 101 mmol/L (ref 98–111)
Creatinine, Ser: 0.93 mg/dL (ref 0.61–1.24)
GFR, Estimated: >60 mL/min (ref >60)
Glucose, Bld: 96 mg/dL (ref 70–99)
Potassium: 4.1 mmol/L (ref 3.5–5.1)
Sodium: 136 mmol/L (ref 135–145)
Total Bilirubin: 0.5 mg/dL (ref 0.0–1.2)
Total Protein: 7.1 g/dL (ref 6.5–8.1)

## 2024-10-02 LAB — URINALYSIS, ROUTINE W REFLEX MICROSCOPIC
Bilirubin Urine: NEGATIVE
Glucose, UA: NEGATIVE mg/dL
Hgb urine dipstick: NEGATIVE
Ketones, ur: 5 mg/dL — AB
Leukocytes,Ua: NEGATIVE
Nitrite: NEGATIVE
Protein, ur: NEGATIVE mg/dL
Specific Gravity, Urine: 1.019 (ref 1.005–1.030)
pH: 6 (ref 5.0–8.0)

## 2024-10-02 LAB — PRO BRAIN NATRIURETIC PEPTIDE: Pro Brain Natriuretic Peptide: 354 pg/mL — ABNORMAL HIGH (ref <300.0)

## 2024-10-02 MED ORDER — ACETAMINOPHEN 500 MG PO TABS
1000.0000 mg | ORAL_TABLET | Freq: Once | ORAL | Status: AC
  Administered 2024-10-02: 22:00:00 1000 mg via ORAL
  Filled 2024-10-02: qty 2

## 2024-10-02 MED ORDER — FUROSEMIDE 10 MG/ML IJ SOLN
40.0000 mg | Freq: Once | INTRAMUSCULAR | Status: AC
  Administered 2024-10-02: 22:00:00 40 mg via INTRAVENOUS
  Filled 2024-10-02: qty 4

## 2024-10-02 MED ORDER — MIRABEGRON ER 25 MG PO TB24
50.0000 mg | ORAL_TABLET | Freq: Every day | ORAL | Status: DC
  Administered 2024-10-03: 50 mg via ORAL
  Filled 2024-10-02: qty 2

## 2024-10-02 MED ORDER — FUROSEMIDE 40 MG PO TABS
20.0000 mg | ORAL_TABLET | Freq: Every day | ORAL | Status: DC
  Administered 2024-10-03: 20 mg via ORAL
  Filled 2024-10-02: qty 1

## 2024-10-02 MED ORDER — POTASSIUM CHLORIDE CRYS ER 20 MEQ PO TBCR
20.0000 meq | EXTENDED_RELEASE_TABLET | Freq: Every day | ORAL | Status: DC
  Administered 2024-10-02 – 2024-10-03 (×2): 20 meq via ORAL
  Filled 2024-10-02 (×2): qty 1

## 2024-10-02 MED ORDER — ACETAMINOPHEN 500 MG PO TABS: 1000.0000 mg | ORAL_TABLET | Freq: Three times a day (TID) | ORAL | Status: DC | PRN

## 2024-10-02 NOTE — ED Triage Notes (Signed)
 Bilateral leg swelling with redness for 3 days. Pt states he has had cellulitis in the past and this looks the same. Pt is on lasix , missed a few doses last week.

## 2024-10-02 NOTE — Discharge Instructions (Signed)
 Please keep your wounds cleaned and dressed. Please follow up with outpatient wound care and PCP. Please have your liver enzymes rechecked within 2 weeks. If you experirence fever/chills or pus drainage from wounds please return immediately to the ED.
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
## 2024-10-03 ENCOUNTER — Telehealth: Payer: Self-pay | Admitting: *Deleted

## 2024-10-03 ENCOUNTER — Telehealth (HOSPITAL_COMMUNITY): Payer: Self-pay | Admitting: Emergency Medicine

## 2024-10-03 DIAGNOSIS — I872 Venous insufficiency (chronic) (peripheral): Secondary | ICD-10-CM | POA: Diagnosis not present

## 2024-10-03 NOTE — Progress Notes (Signed)
 PT rec HHPT. RNCM to asssit.

## 2024-10-03 NOTE — ED Provider Notes (Signed)
 Emergency Medicine Observation Re-evaluation Note  Kenneth Everett is a 78 y.o. male, seen on rounds today.  Pt initially presented to the ED for complaints of Leg Swelling Currently, the patient is resting.  Physical Exam  BP 136/70 (BP Location: Left Arm)   Pulse 61   Temp 98.2 F (36.8 C) (Oral)   Resp 18   Ht 1.854 m (6' 1)   Wt 120.2 kg   SpO2 96%   BMI 34.96 kg/m  Physical Exam   ED Course / MDM  EKG:   I have reviewed the labs performed to date as well as medications administered while in observation.  Recent changes in the last 24 hours include as below.  Plan  Current plan is for going home today after being seen by wound care and PT and OT.    Dasie Faden, MD 10/03/24 [phone]

## 2024-10-03 NOTE — Telephone Encounter (Signed)
 RNCM spoke with pt  regarding discharge planning for Home Health Services. Offered pt medicare.gov list of home health agencies to choose from.  Pt chose [facility] to render RN and PT services. Darleene, liaison of [facility] notified. Patient made aware that [facility] will be in contact in 24-48 hours. HH agency information placed on After Visit Summary (AVS) for pt to contact with any home health questions after discharge. No DME needs identified at this time. Kili Gracy J. Debarah, BSN, RN, [facility] [phone]

## 2024-10-03 NOTE — Consult Note (Addendum)
 This remote consultation was conducted using EMR wound digital images downloaded by the staff member. Pertinent information was reviewed in order to determine recommendations.    WOC Nurse Consult Note: Reason for Consult: BLE Recalcitrant Wounds  Wound type: vascular dysfunction, cellulitis Pressure Injury POA:No Measurement: wide area of involvement Wound bed: multiple open and necrotic lesions, induration, reddish-brown, hemosiderin staining R and L, lesions also to feet and toes, may benefit from vascular studies, provider note indicated leg wraps having been applied, would hold off on medical compression bandages until acute phase of cellulitis is resolved, BLE will require monitoring in between dressing changes by nursing and providers. Patient has many comorbidities that may not allow for proper wound and/or skin healing to occur. Recommend Vashe (antimicrobial solution) to decrease surface bioburden. Drainage see flowsheet Periwound: erythema, edema, possible calor, scar tissue Dressing procedure/placement/frequency:   leanse right and left LE with Vashe solution #[id number] and gauze left over the lesions for 10 minutes, do not rinse off, apply 2 layers of Xeroform yellow guaze #294 wrap around the entire lower legs, cover with 2 rolled Kerlix gauze for each leg, secure in place with wide tape, then wrap legs with ACE bandages from above toes to below knee. Elevate LE onto pillows at all times.  BID    10/02/24 BLE    WOC will not follow and will remove patient from census task list.  Please reconsult if wound worsens in condition and notify provider.   Sherrilyn Hals MSN RN CWOCN WOC [facility]  [phone] (Available from 7-3 pm Mon-Friday)

## 2024-10-03 NOTE — Evaluation (Signed)
 Physical Therapy Evaluation Patient Details Name: Kenneth Everett MRN: [id number] DOB: May 30, 1946 Today's Date: 10/03/2024  History of Present Illness  Kenneth Everett is a 78 y.o. male who presents with Bilateral leg swelling with redness for 3 days. PMH: R foot drop, bil knee OA, morbid obestiy, overactive bladder, presence of IVC filter  Clinical Impression  Pt admitted with above diagnosis. PTA, pt modified ind with upwalker, rollator, electric w/c, using adaptive equipment for self care, has transportation services, has an aide who comes 2x/week for meal prep, has good family support who is available PRN. On eval, pt with R foot drop, BLE wounds wrapped and intact, AROM otherwise WFL. Pt with increased effort and momentum for bed mobility, able to power to stand from elevated bed with CGA, amb 20 ft with RW compensating with hip flexion and not needing seated rest break and no LE buckling. Pt educated in regards to DME and adaptive equipment. Recommend HHPT and pt verbalizes agreement, reports previous success with [facility] services. Pt currently with functional limitations due to the deficits listed below (see PT Problem List). Pt will benefit from acute skilled PT to increase their independence and safety with mobility to allow discharge.           If plan is discharge home, recommend the following: Assistance with cooking/housework;Assist for transportation   Can travel by private vehicle        Equipment Recommendations None recommended by PT  Recommendations for Other Services       Functional Status Assessment Patient has had a recent decline in their functional status and demonstrates the ability to make significant improvements in function in a reasonable and predictable amount of time.     Precautions / Restrictions Precautions Precautions: Fall Recall of Precautions/Restrictions: Intact Precaution/Restrictions Comments: BLE wounds Restrictions Weight Bearing Restrictions Per Provider  Order: No      Mobility  Bed Mobility Overal bed mobility: Needs Assistance Bed Mobility: Supine to Sit, Sit to Supine     Supine to sit: Supervision, HOB elevated, Used rails Sit to supine: Contact guard assist, HOB elevated, Used rails   General bed mobility comments: increasead effort with rocking momentum, heavy use of bedrails and elevated HOB, supv without assist; increased time and effort to lift BLE Back into bed, CGA with use of bedrails and elevated HOB    Transfers Overall transfer level: Needs assistance Equipment used: Rolling walker (2 wheels) Transfers: Sit to/from Stand Sit to Stand: Contact guard assist, From elevated surface           General transfer comment: heavy use of BUE to power to stand from elevated bed for 2 reps    Ambulation/Gait Ambulation/Gait assistance: Contact guard assist, +2 safety/equipment Gait Distance (Feet): 20 Feet Assistive device: Rolling walker (2 wheels) Gait Pattern/deviations: Step-to pattern, Decreased stride length, Decreased dorsiflexion - right Gait velocity: decreased     General Gait Details: R foot drop with hip flexion compensation, step to gait pattern, heavy reliance on BUE on RW, CGA with electric w/c follow but not needed  Stairs            Wheelchair Mobility     Tilt Bed    Modified Rankin (Stroke Patients Only)       Balance Overall balance assessment: Needs assistance Sitting-balance support: Feet supported Sitting balance-Leahy Scale: Fair     Standing balance support: During functional activity, Bilateral upper extremity supported, Reliant on assistive device for balance Standing balance-Leahy Scale: Poor  Pertinent Vitals/Pain Pain Assessment Pain Assessment: No/denies pain    Home Living Family/patient expects to be discharged to:: Private residence Living Arrangements: Alone Available Help at Discharge: Family;Personal care  attendant;Available PRN/intermittently Type of Home: Apartment Home Access: Elevator       Home Layout: One level Home Equipment: Rollator (4 wheels);Other (comment);BSC/3in1;Shower seat - built in;Grab bars - toilet;Grab bars - tub/shower;Hand held shower head;Adaptive equipment;Wheelchair - power (adjustable bed, [employer]) Additional Comments: pt reports good family support, but they work and he wants to remain independent    Prior Function Prior Level of Function : Independent/Modified Independent             Mobility Comments: pt reports ind with upwalker or rollator for short distances in the home or from w/c to vehicle, using electric w/c for distances ADLs Comments: pt reports ind with self care using adaptive equipment, fixes simple meals, has aide who meal preps 2x/week; pt using handicap van service or [employer] for transportation     Extremity/Trunk Assessment   Upper Extremity Assessment Upper Extremity Assessment: Overall WFL for tasks assessed    Lower Extremity Assessment Lower Extremity Assessment: RLE deficits/detail;LLE deficits/detail RLE Deficits / Details: 0/5 dorsiflexion, 5/5 plantarflexion, knee extension 3+/5, hip flexion 3+/5 RLE Coordination: WNL LLE Deficits / Details: ankle 5/5, knee extension 3+/5, hip flexion 3+/5 LLE Coordination: WNL       Communication   Communication Communication: No apparent difficulties    Cognition Arousal: Alert Behavior During Therapy: WFL for tasks assessed/performed   PT - Cognitive impairments: No apparent impairments                         Following commands: Intact       Cueing       General Comments General comments (skin integrity, edema, etc.): BLE wounds wrapping in tact    Exercises     Assessment/Plan    PT Assessment Patient needs continued PT services  PT Problem List Decreased strength;Decreased activity tolerance;Decreased balance;Decreased mobility;Decreased skin  integrity;Pain       PT Treatment Interventions DME instruction;Gait training;Functional mobility training;Therapeutic activities;Therapeutic exercise;Balance training;Neuromuscular re-education;Patient/family education;Wheelchair mobility training    PT Goals (Current goals can be found in the Care Plan section)  Acute Rehab PT Goals Patient Stated Goal: return home, [facility] services PT Goal Formulation: With patient Time For Goal Achievement: 10/17/24 Potential to Achieve Goals: Good    Frequency Min 3X/week     Co-evaluation               AM-PAC PT 6 Clicks Mobility  Outcome Measure Help needed turning from your back to your side while in a flat bed without using bedrails?: A Little Help needed moving from lying on your back to sitting on the side of a flat bed without using bedrails?: A Little Help needed moving to and from a bed to a chair (including a wheelchair)?: A Little Help needed standing up from a chair using your arms (e.g., wheelchair or bedside chair)?: A Little Help needed to walk in hospital room?: A Little Help needed climbing 3-5 steps with a railing? : Total 6 Click Score: 16    End of Session Equipment Utilized During Treatment: Gait belt Activity Tolerance: Patient tolerated treatment well Patient left: in bed;with call bell/phone within reach;with bed alarm set Nurse Communication: Mobility status PT Visit Diagnosis: Difficulty in walking, not elsewhere classified (R26.2);Other abnormalities of gait and mobility (R26.89)    Time: [phone]  PT Time Calculation (min) (ACUTE ONLY): 27 min   Charges:   PT Evaluation $PT Eval Low Complexity: 1 Low PT Treatments $Gait Training: 8-22 mins PT General Charges $$ ACUTE PT VISIT: 1 Visit         Tori Gracyn Allor PT, DPT 10/03/2024, 9:47 AM

## 2024-10-03 NOTE — ED Notes (Signed)
 Dressing on bil removed, applied wet 4x4 with Vashe for 10 min, removed. 2 layers of xeroform placed over wounds followed by kerlix then ace wraps applied. Pt verified verbally they felt fine with firmness. Socks with grippy bottoms placed on feet.

## 2024-10-10 ENCOUNTER — Encounter (HOSPITAL_COMMUNITY): Payer: Self-pay | Admitting: *Deleted

## 2024-10-10 ENCOUNTER — Other Ambulatory Visit: Payer: Self-pay

## 2024-10-10 ENCOUNTER — Emergency Department (HOSPITAL_COMMUNITY)

## 2024-10-10 ENCOUNTER — Emergency Department (HOSPITAL_COMMUNITY)
Admission: EM | Admit: 2024-10-10 | Discharge: 2024-10-11 | Disposition: A | Discharge status: Home / Self Care | Source: Ambulatory Visit | Attending: Emergency Medicine | Admitting: Emergency Medicine

## 2024-10-10 DIAGNOSIS — I890 Lymphedema, not elsewhere classified: Secondary | ICD-10-CM | POA: Diagnosis not present

## 2024-10-10 DIAGNOSIS — Z993 Dependence on wheelchair: Secondary | ICD-10-CM | POA: Insufficient documentation

## 2024-10-10 DIAGNOSIS — M79606 Pain in leg, unspecified: Secondary | ICD-10-CM | POA: Insufficient documentation

## 2024-10-10 DIAGNOSIS — M21371 Foot drop, right foot: Secondary | ICD-10-CM | POA: Diagnosis not present

## 2024-10-10 DIAGNOSIS — R600 Localized edema: Secondary | ICD-10-CM | POA: Diagnosis present

## 2024-10-10 DIAGNOSIS — L03116 Cellulitis of left lower limb: Secondary | ICD-10-CM | POA: Diagnosis present

## 2024-10-10 DIAGNOSIS — E7800 Pure hypercholesterolemia, unspecified: Secondary | ICD-10-CM | POA: Insufficient documentation

## 2024-10-10 LAB — CBC WITH DIFFERENTIAL/PLATELET
Abs Immature Granulocytes: 0.03 K/uL (ref 0.00–0.07)
Basophils Absolute: 0.1 K/uL (ref 0.0–0.1)
Basophils Relative: 1 %
Eosinophils Absolute: 0 K/uL (ref 0.0–0.5)
Eosinophils Relative: 0 %
HCT: 40.6 % (ref 39.0–52.0)
Hemoglobin: 12.8 g/dL — ABNORMAL LOW (ref 13.0–17.0)
Immature Granulocytes: 0 %
Lymphocytes Relative: 19 %
Lymphs Abs: 1.6 K/uL (ref 0.7–4.0)
MCH: 28.2 pg (ref 26.0–34.0)
MCHC: 31.5 g/dL (ref 30.0–36.0)
MCV: 89.4 fL (ref 80.0–100.0)
Monocytes Absolute: 0.9 K/uL (ref 0.1–1.0)
Monocytes Relative: 11 %
Neutro Abs: 5.9 K/uL (ref 1.7–7.7)
Neutrophils Relative %: 69 %
Platelets: 197 K/uL (ref 150–400)
RBC: 4.54 MIL/uL (ref 4.22–5.81)
RDW: 14.6 % (ref 11.5–15.5)
WBC: 8.5 K/uL (ref 4.0–10.5)
nRBC: 0 % (ref 0.0–0.2)

## 2024-10-10 LAB — COMPREHENSIVE METABOLIC PANEL WITH GFR
ALT: 19 U/L (ref 0–44)
AST: 33 U/L (ref 15–41)
Albumin: 3.7 g/dL (ref 3.5–5.0)
Alkaline Phosphatase: 87 U/L (ref 38–126)
Anion gap: 10 (ref 5–15)
BUN: 25 mg/dL — ABNORMAL HIGH (ref 8–23)
CO2: 23 mmol/L (ref 22–32)
Calcium: 9 mg/dL (ref 8.9–10.3)
Chloride: 107 mmol/L (ref 98–111)
Creatinine, Ser: 0.83 mg/dL (ref 0.61–1.24)
GFR, Estimated: >60 mL/min
Glucose, Bld: 95 mg/dL (ref 70–99)
Potassium: 4.8 mmol/L (ref 3.5–5.1)
Sodium: 139 mmol/L (ref 135–145)
Total Bilirubin: 0.6 mg/dL (ref 0.0–1.2)
Total Protein: 7.1 g/dL (ref 6.5–8.1)

## 2024-10-10 LAB — PRO BRAIN NATRIURETIC PEPTIDE: Pro Brain Natriuretic Peptide: 307 pg/mL — ABNORMAL HIGH

## 2024-10-10 LAB — I-STAT CG4 LACTIC ACID, ED: Lactic Acid, Venous: 0.5 mmol/L (ref 0.5–1.9)

## 2024-10-10 MED ORDER — CEPHALEXIN 500 MG PO CAPS
500.0000 mg | ORAL_CAPSULE | Freq: Once | ORAL | Status: AC
  Administered 2024-10-10: 500 mg via ORAL
  Filled 2024-10-10: qty 1

## 2024-10-10 MED ORDER — CEPHALEXIN 500 MG PO CAPS: 500.0000 mg | ORAL_CAPSULE | Freq: Two times a day (BID) | ORAL | 0 refills | Status: AC

## 2024-10-10 NOTE — ED Triage Notes (Signed)
 Bil ankle cellulitis, hospitalized till last Friday, HH has been coming out, told to get antibiotics, went to PCP today and was sent here.

## 2024-10-10 NOTE — ED Notes (Signed)
 Urine sample collected and sent to lab.

## 2024-10-10 NOTE — Discharge Instructions (Signed)
 Begin taking Keflex  as prescribed.  Elevate leg on pillows at nighttime to help with swelling.  An outpatient ultrasound of your leg has been ordered.  Instructions are on the paper for follow-up.  As it is a weekend tomorrow, the ultrasound can be done at [facility]  Please follow-up with PCP in 1 week for recheck.  Return to ED if any symptoms worsen including severe pain in the leg, new fevers, severe drainage from the leg.

## 2024-10-10 NOTE — ED Notes (Signed)
 Contacted [facility] to take pt home via wheelchair van. [facility] stated they do not transport patients to their residency after 6pm. A requested was put in per [place] [facility] [place] transport for 7am pickup time with [facility].

## 2024-10-10 NOTE — ED Notes (Signed)
 [facility] contacted and declined to transport due to electrical wheelchair.
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
## 2024-10-11 ENCOUNTER — Ambulatory Visit (HOSPITAL_COMMUNITY): Admission: RE | Admit: 2024-10-11 | Payer: PRIVATE HEALTH INSURANCE | Source: Ambulatory Visit

## 2024-10-11 NOTE — ED Notes (Signed)
 Report received from previous RN. Pt awaiting safe transport back to residence after 7 am. Has personal powered wheelchair at bedside. Pt resting comfortably in hallway bed. No resp equal and unlabored.

## 2024-10-11 NOTE — ED Notes (Signed)
 10/11/24 0915 pt called for a ride and also provided a schedule for the bus stop with directions to pick up. He elected at this time to leave the facility in his motorized wheelchair.

## 2024-10-11 NOTE — ED Notes (Signed)
 10/11/24 1000 pt went to bus stop as security directed then returned stating it is unsafe for him. Security verified he is calling for rides and for some reason they do not show up.  10/11/24 1125 pt remains in lobby. Writer contacted [employer] and [employer] to see if they can transport him with his w/c today. 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 This information was given to security who is taking it to pt to call and arrange a ride.

## 2024-10-19 ENCOUNTER — Other Ambulatory Visit: Payer: Self-pay

## 2024-10-19 ENCOUNTER — Ambulatory Visit (HOSPITAL_COMMUNITY): Admission: RE | Admit: 2024-10-19 | Discharge: 2024-10-19 | Disposition: A | Source: Ambulatory Visit

## 2024-10-19 ENCOUNTER — Emergency Department (HOSPITAL_COMMUNITY)
Admission: EM | Admit: 2024-10-19 | Discharge: 2024-10-19 | Disposition: A | Discharge status: Home / Self Care | Source: Ambulatory Visit | Attending: Emergency Medicine | Admitting: Emergency Medicine

## 2024-10-19 ENCOUNTER — Encounter (HOSPITAL_COMMUNITY): Payer: Self-pay

## 2024-10-19 DIAGNOSIS — M79605 Pain in left leg: Secondary | ICD-10-CM

## 2024-10-19 DIAGNOSIS — L0390 Cellulitis, unspecified: Secondary | ICD-10-CM | POA: Insufficient documentation

## 2024-10-19 DIAGNOSIS — Z7901 Long term (current) use of anticoagulants: Secondary | ICD-10-CM | POA: Insufficient documentation

## 2024-10-19 DIAGNOSIS — I82412 Acute embolism and thrombosis of left femoral vein: Secondary | ICD-10-CM | POA: Insufficient documentation

## 2024-10-19 DIAGNOSIS — I872 Venous insufficiency (chronic) (peripheral): Secondary | ICD-10-CM | POA: Insufficient documentation

## 2024-10-19 DIAGNOSIS — R609 Edema, unspecified: Secondary | ICD-10-CM | POA: Diagnosis present

## 2024-10-19 DIAGNOSIS — M7989 Other specified soft tissue disorders: Secondary | ICD-10-CM | POA: Diagnosis present

## 2024-10-19 MED ORDER — RIVAROXABAN (XARELTO) EDUCATION KIT FOR DVT/PE PATIENTS: PACK | Freq: Once | Status: DC

## 2024-10-19 MED ORDER — RIVAROXABAN (XARELTO) VTE STARTER PACK (15 & 20 MG): ORAL_TABLET | ORAL | 0 refills | Status: AC

## 2024-10-19 MED ORDER — APIXABAN (ELIQUIS) EDUCATION KIT FOR DVT/PE PATIENTS: PACK | Freq: Once | Status: DC

## 2024-10-19 MED ORDER — RIVAROXABAN 15 MG PO TABS
15.0000 mg | ORAL_TABLET | Freq: Once | ORAL | Status: AC
  Administered 2024-10-19: 15 mg via ORAL
  Filled 2024-10-19: qty 1

## 2024-10-19 NOTE — Progress Notes (Signed)
 VASCULAR LAB    Left lower extremity venous duplex has been performed.  See CV proc for preliminary results.  Patient taken to ED registration to be checked in for DVT.  Vielka Klinedinst, RVT 10/19/2024, 12:24 PM

## 2024-10-19 NOTE — ED Triage Notes (Signed)
 Pt arrives via POV. Pt reports he just had an US  of his left leg. Was told to come to the ED due to a blood clot in his left leg. Pt denies cp or sob. AxOx4

## 2024-10-19 NOTE — ED Provider Triage Note (Signed)
 Emergency Medicine Provider Triage Evaluation Note  Kenneth Everett , a 79 y.o. male  was evaluated in triage.  He reports that he has had some left lower extremity swelling for about the past 1 to 2 weeks.  He had a ultrasound of his left lower extremity performed today and was told he had a blood clot and he should come to the ER.  Denies any chest pain or shortness of breath.  He reports that he had some erythema to his left lower extremity about a week ago which he was started on Keflex  for and this has improved.  Denies any fever or chills at home.  Review of Systems  Positive: As above Negative: As above  Physical Exam  BP 137/72   Pulse (!) 55   Temp 98.4 F (36.9 C)   Resp 18   Ht 6' 1 (1.854 m)   Wt 117.9 kg   SpO2 96%   BMI 34.30 kg/m  Gen:   Awake, no distress   Resp:  Normal effort  MSK:   Moves extremities without difficulty  Other:  1+ pedal pulse in left lower extremity.  Mild erythema to the left foot and ankle which patient reports is improving  Medical Decision Making  Medically screening exam initiated at 1:12 PM.  Appropriate orders placed.  Quatavious Eagles was informed that the remainder of the evaluation will be completed by another provider, this initial triage assessment does not replace that evaluation, and the importance of remaining in the ED until their evaluation is complete.     Veta Palma, PA-C 10/19/24 1534

## 2024-10-19 NOTE — Discharge Instructions (Signed)
 Information on my medicine - XARELTO (rivaroxaban)  This medication education was reviewed with me or my healthcare representative as part of my discharge preparation.    WHY WAS XARELTO PRESCRIBED FOR YOU? Xarelto was prescribed to treat blood clots that may have been found in the veins of your legs (deep vein thrombosis) or in your lungs (pulmonary embolism) and to reduce the risk of them occurring again.  What do you need to know about Xarelto? The starting dose is one 15 mg tablet taken TWICE daily with food for the FIRST 21 DAYS then the dose is changed to one 20 mg tablet taken ONCE A DAY with your evening meal.  DO NOT stop taking Xarelto without talking to the health care provider who prescribed the medication.  Refill your prescription for 20 mg tablets before you run out.  After discharge, you should have regular check-up appointments with your healthcare provider that is prescribing your Xarelto.  In the future your dose may need to be changed if your kidney function changes by a significant amount.  What do you do if you miss a dose? If you are taking Xarelto TWICE DAILY and you miss a dose, take it as soon as you remember. You may take two 15 mg tablets (total 30 mg) at the same time then resume your regularly scheduled 15 mg twice daily the next day.  If you are taking Xarelto ONCE DAILY and you miss a dose, take it as soon as you remember on the same day then continue your regularly scheduled once daily regimen the next day. Do not take two doses of Xarelto at the same time.   Important Safety Information Xarelto is a blood thinner medicine that can cause bleeding. You should call your healthcare provider right away if you experience any of the following: ? Bleeding from an injury or your nose that does not stop. ? Unusual colored urine (red or dark brown) or unusual colored stools (red or black). ? Unusual bruising for unknown reasons. ? A serious fall or if you hit  your head (even if there is no bleeding).  Some medicines may interact with Xarelto and might increase your risk of bleeding while on Xarelto. To help avoid this, consult your healthcare provider or pharmacist prior to using any new prescription or non-prescription medications, including herbals, vitamins, non-steroidal anti-inflammatory drugs (NSAIDs) and supplements.  This website has more information on Xarelto: VisitDestination.com.br.

## 2024-10-19 NOTE — ED Provider Notes (Signed)
 " [place] EMERGENCY DEPARTMENT AT [facility] Provider Note   CSN: [id number] Arrival date & time: 10/19/24  1228     Patient presents with: Claudication   Marice Credit is a 79 y.o. male.   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  Patient reports that he has had chronic leg swelling.  He has been on antibiotics recently and overall his symptoms have actually improved.  He however had also DVT study done today, and was advised to come to the ER because it was positive.  Patient denies any current chest pain, shortness of breath and has no exertional changes that he has noted.  He indicates that he had IVC filter placed several years ago in [place] .  He had a right lower extremity DVT and was also placed on blood thinners for short period of time.  He was told that his DVT is better and was asked to discontinue the blood thinner.  He does not recall why the IVC was placed and if that IVC filter was placed in conjunction of the DVT diagnosis.   At this time, patient denies any bloody stools, previous history of brain bleed or severe GI bleed.  Patient has seen vascular doctors in the past because of his venous stasis issues.  Prior to Admission medications  Medication Sig Start Date End Date Taking? Authorizing Provider  RIVAROXABAN  (XARELTO ) VTE STARTER PACK (15 & 20 MG) Follow package directions: Take one 15mg  tablet by mouth twice a day. On day 22, switch to one 20mg  tablet once a day. Take with food. 10/19/24  Yes Charlyn Sora, MD  ascorbic acid (VITAMIN C) 500 MG tablet Take 500 mg by mouth daily.    [provider]  atorvastatin  (LIPITOR) 20 MG tablet Take 20 mg by mouth daily. 07/25/21   [provider]  B Complex Vitamins (VITAMIN B COMPLEX PO) Take 1 tablet by mouth daily.    [provider]   cephALEXin  (KEFLEX ) 500 MG capsule Take 1 capsule (500 mg total) by mouth 2 (two) times daily for 10 days. 10/10/24 10/20/24  Neysa Thersia RAMAN, PA-C  Cholecalciferol (VITAMIN D3) 50 MCG (2000 UT) TABS Take 1 tablet by mouth daily.    [provider]  Coenzyme Q10 (CO Q10) 100 MG CAPS Take 1 capsule by mouth daily.    [provider]  finasteride  (PROSCAR ) 5 MG tablet Take 5 mg by mouth daily. 07/23/21   [provider]  Glucosamine 750 MG TABS Take 1 tablet by mouth in the morning, at noon, and at bedtime.    [provider]  Lutein 10 MG TABS Take 1 tablet by mouth daily.    [provider]  Multiple Vitamin (MULTIVITAMIN ADULT) TABS Take 1 tablet by mouth daily.    [provider]  Omega-3 Fatty Acids (FISH OIL) 1000 MG CAPS Take 1,000 mg by mouth daily.    [provider]  Turmeric 500 MG CAPS Take 1 capsule by mouth in the morning and at bedtime.    [provider]    Allergies: Patient has no known allergies.    Review of Systems  All other systems reviewed and are negative.   Updated Vital Signs BP 137/72   Pulse (!) 55   Temp 98.4 F (36.9 C)   Resp 18  Ht 6' 1 (1.854 m)   Wt 117.9 kg   SpO2 96%   BMI 34.30 kg/m   Physical Exam Vitals and nursing note reviewed.  Constitutional:      Appearance: He is well-developed.  HENT:     Head: Atraumatic.  Cardiovascular:     Rate and Rhythm: Normal rate.  Pulmonary:     Effort: Pulmonary effort is normal.  Musculoskeletal:        General: Swelling present. No tenderness.     Cervical back: Neck supple.     Right lower leg: Edema present.     Left lower leg: Edema present.     Comments: Patient's legs are wrapped in nonadhesive dressing.  Patient has bilateral pitting edema and leg swelling, but no significant tenderness to palpation.  Left lower extremity does appear slightly more red.  Skin:    General: Skin is warm.     Findings: Erythema present.   Neurological:     Mental Status: He is alert and oriented to person, place, and time.     (all labs ordered are listed, but only abnormal results are displayed) Labs Reviewed - No data to display  EKG: None  Radiology: LE Venous Result Date: 10/19/2024  Lower Venous DVT Study Patient Name:  AEMON KOELLER  Date of Exam:   10/19/2024 Medical Rec #: [id number]   Accession #:    [id number] Date of Birth: [birth year]  Patient Gender: M Patient Age:   3 years Exam Location:  [facility] Procedure:      VAS US  LOWER EXTREMITY VENOUS (DVT) Referring Phys: ALEXIS YOUNG --------------------------------------------------------------------------------  Indications: Pain, Edema, Erythema. Patient was supposed to return for LE venous study 10/11/24 after 2nd visit to ED for cellulitis, but did not come until 10/19/24. Other Indications: Cellulitis diagnosed 10/02/24 after cruise where he did not                    take Lasix . Chronic venous insufficiency . Risk Factors: IVC filter placed in [place]  2005. Limitations: Body habitus, bandages and open wound. Comparison Study: No prior study on file Performing Technologist: Alberta Lis RVS  Examination Guidelines: A complete evaluation includes B-mode imaging, spectral Doppler, color Doppler, and power Doppler as needed of all accessible portions of each vessel. Bilateral testing is considered an integral part of a complete examination. Limited examinations for reoccurring indications may be performed as noted. The reflux portion of the exam is performed with the patient in reverse Trendelenburg.  +-----+---------------+---------+-----------+----------+--------------+ RIGHTCompressibilityPhasicitySpontaneityPropertiesThrombus Aging +-----+---------------+---------+-----------+----------+--------------+ CFV  Full           Yes      Yes                                 +-----+---------------+---------+-----------+----------+--------------+ SFJ  Full                                                         +-----+---------------+---------+-----------+----------+--------------+   +---------+---------------+---------+-----------+----------+-------------------+ LEFT     CompressibilityPhasicitySpontaneityPropertiesThrombus Aging      +---------+---------------+---------+-----------+----------+-------------------+ CFV      Full           Yes      No                                       +---------+---------------+---------+-----------+----------+-------------------+  SFJ      Full                                                             +---------+---------------+---------+-----------+----------+-------------------+ FV Prox  Partial        No       No                   Age Indeterminate   +---------+---------------+---------+-----------+----------+-------------------+ FV Mid   Partial        Yes      No                   Age Indeterminate   +---------+---------------+---------+-----------+----------+-------------------+ FV DistalPartial        Yes      No                   Age Indeterminate   +---------+---------------+---------+-----------+----------+-------------------+ PFV      None                                         Age Indeterminate   +---------+---------------+---------+-----------+----------+-------------------+ POP      Full           No       Yes                                      +---------+---------------+---------+-----------+----------+-------------------+ PTV                                                   Not well visualized +---------+---------------+---------+-----------+----------+-------------------+ PERO                                                  Not well visualized +---------+---------------+---------+-----------+----------+-------------------+     Summary: RIGHT: - No evidence of common femoral vein obstruction.   LEFT: - Findings consistent with age  indeterminate deep vein thrombosis involving the left femoral vein.  - No cystic structure found in the popliteal fossa.  *See table(s) above for measurements and observations. Electronically signed by Penne Colorado MD on 10/19/2024 at 1:40:37 PM.    Final      Procedures   Medications Ordered in the ED  Rivaroxaban  (XARELTO ) tablet 15 mg (has no administration in time range)                                    Medical Decision Making Risk Prescription drug management.   This patient presents to the ED with chief complaint(s) of positive DVT study with pertinent past medical history of chronic venous stasis edema of bilateral lower extremity, previous history of DVT in the right lower extremity and possibly IVC filter placement in 2005, details of which are not available to us   and not readily recalled by the patient. The complaint involves an extensive differential diagnosis and also carries with it a high risk of complications and morbidity.     I have also reviewed Care Everywhere/External Records and vascular surgery notes from few years ago.  Patient was being assessed by vascular surgery for chronic venous ulcers.  There was no mention of IVC filter during those clinic visits. I also reviewed the DVT study from earlier today and recent ER visits.  DVT study finding is as follows: LEFT:   - Findings consistent with age indeterminate deep vein thrombosis  involving the left femoral vein.    I reviewed patient's labs including recent metabolic profile from end of December that revealed normal creatinine.  Patient denies any chest pain, shortness of breath any exertional dyspnea and during my evaluation his O2 sats are at 100% with respiratory rate at 16.  I do not think we need to pursue CT PE workup at this time. We will start patient on Xarelto .  Given the complexity of questionable IVC filter, and the details surrounding it, I think it would be useful for patient to follow-up with  vascular DVT clinic.   Reassessments: The following labs were independently interpreted: Previous CMP and CBC with normal platelet.  Consideration for admission or further workup: CT PE was considered, but not clinically thought to be useful at this time.    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   10/19/24 1614    RIVAROXABAN  (XARELTO ) VTE STARTER PACK (15 & 20 MG)        10/19/24 1617               Charlyn Sora, MD 10/19/24 1636  "

## 2024-10-19 NOTE — ED Notes (Signed)
..  Patient is A&Ox4 upon discharge. Patient verbalized understanding of prescriptions, discharge instructions and follow-up care.

## 2024-11-07 ENCOUNTER — Encounter: Payer: Self-pay | Admitting: Podiatry

## 2024-11-07 ENCOUNTER — Ambulatory Visit (INDEPENDENT_AMBULATORY_CARE_PROVIDER_SITE_OTHER): Admitting: Podiatry

## 2024-11-07 DIAGNOSIS — M7751 Other enthesopathy of right foot: Secondary | ICD-10-CM | POA: Diagnosis not present

## 2024-11-07 NOTE — Progress Notes (Signed)
 Presents complaint of pain on the dorsal medial aspect of the IPJ of the hallux right.  Has been getting sore noticed a getting a little bit red.  Does not recall any injury to the toe.  Patient has significant lower extremity weakness in the legs.   Physical exam:  General appearance: Pleasant, and in no acute distress. AOx3.  Vascular: Pedal pulses: DP 2/4 bilaterally, PT 0/4 bilaterally.  Moderate to severe edema lower legs bilaterally. Capillary fill time immediate bilaterally.  Neurological: Light touch intact feet bilaterally.  Diminished Achilles reflex bilaterally.   Dermatologic:   Area of redness no no skin breakdown over the dorsal aspect of the IPJ of the hallux right.  Skin normal temperature bilaterally.  Skin normal color, tone, and texture bilaterally.   Musculoskeletal: Severe hallux malleus right.  Tenderness of the dorsal and specially dorsal medial aspect of the IPJ of the hallux.  Very little range of motion at the interphalangeal joint with a rigid contracture of the joint.   Diagnosis: 1.  Capsulitis IPJ hallux right  Plan: -S/p visit for evaluation management level 3. - Discussed with him the pain at the joint because of the contracture.  Will get him a silicone sleeve to slide of the toe foot from this.  Recommended doing well cushioned socks good padding in the toes.  Can use topical OTC Voltaren gel over the area.  If does not improve we can try an injection. -Has been silicone gel sleeve for hallux right  Return for regularly scheduled RFC

## 2024-11-10 ENCOUNTER — Encounter (HOSPITAL_BASED_OUTPATIENT_CLINIC_OR_DEPARTMENT_OTHER): Payer: PRIVATE HEALTH INSURANCE | Admitting: Internal Medicine

## 2024-12-01 ENCOUNTER — Encounter (HOSPITAL_BASED_OUTPATIENT_CLINIC_OR_DEPARTMENT_OTHER): Payer: PRIVATE HEALTH INSURANCE | Admitting: Internal Medicine

## 2024-12-17 ENCOUNTER — Ambulatory Visit: Admitting: Podiatry
